# Patient Record
Sex: Female | Born: 1985 | Race: White | Hispanic: No | Marital: Single | Smoking: Former smoker
Health system: Southern US, Community
[De-identification: ages and names within clinical notes are randomized; demographics above are authoritative.]

## PROBLEM LIST (undated history)

## (undated) DIAGNOSIS — Z8619 Personal history of other infectious and parasitic diseases: Secondary | ICD-10-CM

## (undated) DIAGNOSIS — G901 Familial dysautonomia [Riley-Day]: Secondary | ICD-10-CM

## (undated) DIAGNOSIS — T7840XA Allergy, unspecified, initial encounter: Secondary | ICD-10-CM

## (undated) DIAGNOSIS — Z87898 Personal history of other specified conditions: Secondary | ICD-10-CM

## (undated) DIAGNOSIS — F419 Anxiety disorder, unspecified: Secondary | ICD-10-CM

## (undated) DIAGNOSIS — R55 Syncope and collapse: Secondary | ICD-10-CM

## (undated) HISTORY — PX: OTHER SURGICAL HISTORY: SHX169

## (undated) HISTORY — DX: Allergy, unspecified, initial encounter: T78.40XA

## (undated) HISTORY — DX: Syncope and collapse: R55

## (undated) HISTORY — DX: Anxiety disorder, unspecified: F41.9

## (undated) HISTORY — DX: Familial dysautonomia (riley-day): G90.1

## (undated) HISTORY — DX: Personal history of other specified conditions: Z87.898

## (undated) HISTORY — DX: Personal history of other infectious and parasitic diseases: Z86.19

---
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
## 2006-06-04 ENCOUNTER — Ambulatory Visit: Payer: Self-pay | Admitting: Family Medicine

## 2006-06-05 ENCOUNTER — Ambulatory Visit: Payer: Self-pay | Admitting: Family Medicine

## 2006-07-26 ENCOUNTER — Emergency Department (HOSPITAL_COMMUNITY): Admission: EM | Admit: 2006-07-26 | Discharge: 2006-07-26 | Payer: Self-pay | Admitting: Family Medicine

## 2006-09-23 ENCOUNTER — Emergency Department (HOSPITAL_COMMUNITY): Admission: EM | Admit: 2006-09-23 | Discharge: 2006-09-23 | Payer: Self-pay | Admitting: Family Medicine

## 2007-01-31 ENCOUNTER — Emergency Department (HOSPITAL_COMMUNITY): Admission: EM | Admit: 2007-01-31 | Discharge: 2007-01-31 | Payer: Self-pay | Admitting: Family Medicine

## 2007-02-02 ENCOUNTER — Encounter: Payer: Self-pay | Admitting: Family Medicine

## 2007-02-02 DIAGNOSIS — Z87898 Personal history of other specified conditions: Secondary | ICD-10-CM

## 2007-02-02 HISTORY — DX: Personal history of other specified conditions: Z87.898

## 2007-07-28 ENCOUNTER — Emergency Department (HOSPITAL_COMMUNITY): Admission: EM | Admit: 2007-07-28 | Discharge: 2007-07-28 | Payer: Self-pay | Admitting: Family Medicine

## 2008-01-21 ENCOUNTER — Emergency Department (HOSPITAL_COMMUNITY): Admission: EM | Admit: 2008-01-21 | Discharge: 2008-01-21 | Payer: Self-pay | Admitting: Family Medicine

## 2009-04-02 ENCOUNTER — Emergency Department (HOSPITAL_COMMUNITY): Admission: EM | Admit: 2009-04-02 | Discharge: 2009-04-02 | Payer: Self-pay | Admitting: Emergency Medicine

## 2009-07-02 ENCOUNTER — Inpatient Hospital Stay (HOSPITAL_COMMUNITY): Admission: AD | Admit: 2009-07-02 | Discharge: 2009-07-04 | Payer: Self-pay | Admitting: Obstetrics and Gynecology

## 2010-08-27 LAB — HM PAP SMEAR

## 2010-12-16 ENCOUNTER — Emergency Department (HOSPITAL_COMMUNITY)
Admission: EM | Admit: 2010-12-16 | Discharge: 2010-12-16 | Payer: Self-pay | Source: Home / Self Care | Admitting: Family Medicine

## 2011-02-22 LAB — CBC
HCT: 34.2 % — ABNORMAL LOW (ref 36.0–46.0)
MCHC: 34.2 g/dL (ref 30.0–36.0)
MCHC: 34.4 g/dL (ref 30.0–36.0)
MCV: 92.4 fL (ref 78.0–100.0)
Platelets: 181 10*3/uL (ref 150–400)
Platelets: 211 10*3/uL (ref 150–400)
RDW: 13.3 % (ref 11.5–15.5)

## 2011-02-22 LAB — RPR: RPR Ser Ql: NONREACTIVE

## 2011-04-01 NOTE — Discharge Summary (Signed)
NAMEMACHELLE, [name]              ACCOUNT NO.:  [id number]   MEDICAL RECORD NO.:  [id number]          PATIENT TYPE:  INP   LOCATION:  9107                          FACILITY:  [facility]   PHYSICIAN:  Huel Cote, M.D. DATE OF BIRTH:  10-Jan-1986   DATE OF ADMISSION:  07/02/2009  DATE OF DISCHARGE:  07/04/2009                               DISCHARGE SUMMARY   DISCHARGE DIAGNOSES:  1. Term pregnancy at 57 weeks' delivered.  2. Status post normal spontaneous vaginal delivery.   DISCHARGE MEDICATIONS:  1. Motrin 600 mg p.o. every 6 hours.  2. Percocet 1-2 tablets p.o. every 4 hours p.r.n.   DISCHARGE FOLLOWUP:  The patient is to followup in the office in 6 weeks  for her full postpartum exam.   HOSPITAL COURSE:  The patient is a 25 year old, G1, P0, who was admitted  at 31 and 1/7th weeks' gestation after she had presented to the office  with a regular contractions and cervical change to 4-5 cm.  Her prenatal  care was uncomplicated except for a history of anxiety and panic  attacks.   PRENATAL LABORATORY DATA:  O positive, antibody negative, rubella  immune, hepatitis B surface antigen negative, HIV negative, GC negative,  chlamydia negative, 1-hour Glucola normal, RPR negative, declined  genetic screening, and group B strep was negative.   PAST OBSTETRICAL HISTORY:  None.   PAST GYN HISTORY:  She had an ASCUS Pap smear which was positive HPV, so  she will repeat this postpartum.   PAST SURGICAL HISTORY:  None.   PAST MEDICAL HISTORY:  Anxiety and panic attacks, on no medications  currently.   ALLERGIES:  None.   MEDICATIONS:  She uses Ambien 10 mg p.o. in the last week for sleep.   She was afebrile with stable vital signs.  Fetal heart rate was  reactive.  Cervix after admission and receiving an epidural was 90, 6-7,  and -1 station.  She had rupture of membranes, which was clear and  continued to progress well.  She reached complete dilation and pushed  well with a  normal spontaneous vaginal delivery of a vigorous female  infant over a first-degree laceration.  Apgars were 9 and 9, weight was  6 pounds 15 ounces.  Placenta delivered spontaneously.  A first-degree  laceration was repaired with 2-0 Vicryl for hemostasis.  Postpartum, she  did  quite well.  Her hemoglobin was 10.9.  By postpartum day #2, she felt  well and was ready for discharge home.  She was discharged with Motrin  and Percocet medications and with followup in 6 weeks and was instructed  on pelvic rest.      Huel Cote, M.D.  Electronically Signed     KR/MEDQ  D:  07/04/2009  T:  07/04/2009  Job:  [id number]

## 2011-04-04 ENCOUNTER — Inpatient Hospital Stay (INDEPENDENT_AMBULATORY_CARE_PROVIDER_SITE_OTHER)
Admission: RE | Admit: 2011-04-04 | Discharge: 2011-04-04 | Disposition: A | Discharge status: Home / Self Care | Payer: 59 | Source: Ambulatory Visit | Attending: Family Medicine | Admitting: Family Medicine

## 2011-04-04 DIAGNOSIS — J029 Acute pharyngitis, unspecified: Secondary | ICD-10-CM

## 2011-04-05 LAB — STREP A DNA PROBE

## 2011-08-11 LAB — POCT PREGNANCY, URINE: Operator id: 126491

## 2011-08-11 LAB — POCT URINALYSIS DIP (DEVICE)
Bilirubin Urine: NEGATIVE
Glucose, UA: NEGATIVE
Ketones, ur: NEGATIVE
Nitrite: NEGATIVE
pH: 7

## 2011-08-29 LAB — POCT URINALYSIS DIP (DEVICE)
Bilirubin Urine: NEGATIVE
Glucose, UA: NEGATIVE
Ketones, ur: NEGATIVE
Nitrite: NEGATIVE
Operator id: 247071
Protein, ur: NEGATIVE
Specific Gravity, Urine: 1.01
Urobilinogen, UA: 0.2
pH: 5.5

## 2011-08-29 LAB — POCT PREGNANCY, URINE: Operator id: 239701

## 2013-01-03 ENCOUNTER — Ambulatory Visit (INDEPENDENT_AMBULATORY_CARE_PROVIDER_SITE_OTHER): Payer: 59 | Admitting: Internal Medicine

## 2013-01-03 ENCOUNTER — Encounter: Payer: Self-pay | Admitting: Internal Medicine

## 2013-01-03 VITALS — BP 117/74 | HR 90 | Ht 68.0 in | Wt 140.0 lb

## 2013-01-03 DIAGNOSIS — F419 Anxiety disorder, unspecified: Secondary | ICD-10-CM | POA: Insufficient documentation

## 2013-01-03 DIAGNOSIS — G901 Familial dysautonomia [Riley-Day]: Secondary | ICD-10-CM

## 2013-01-03 DIAGNOSIS — R55 Syncope and collapse: Secondary | ICD-10-CM

## 2013-01-03 DIAGNOSIS — G909 Disorder of the autonomic nervous system, unspecified: Secondary | ICD-10-CM

## 2013-01-03 DIAGNOSIS — F411 Generalized anxiety disorder: Secondary | ICD-10-CM

## 2013-01-03 HISTORY — DX: Syncope and collapse: R55

## 2013-01-03 HISTORY — DX: Familial dysautonomia (riley-day): G90.1

## 2013-01-03 NOTE — Patient Instructions (Addendum)
Your physician recommends that you schedule a follow-up appointment in: 6 weeks  

## 2013-01-03 NOTE — Assessment & Plan Note (Signed)
This patient's spells are little bit atypical but I think are most consistent with neurally mediated syncope. She has some evidence for postural tachycardia although not enough to make the diagnosis. She has some exercise intolerance also consistent with these diagnoses. We discussed extensively the physiology and the importance of salt and water repletion and potential aggravating factors of stress and anxiety.  I've given her website information from [employer].org as well as [employer].    We will plan to see her again in 3 or so months. At that time we'll consider the addition of anti anxiety/antidepressants although at that point I would prefer to refer her to somebody who can manage those medications with her better than I
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
## 2013-01-03 NOTE — Assessment & Plan Note (Signed)
She is being carried diagnoses of anxiety; I think it may be contributing but probably not the primary issue.

## 2013-01-03 NOTE — Assessment & Plan Note (Signed)
As above.

## 2013-01-06 ENCOUNTER — Telehealth: Payer: Self-pay | Admitting: Internal Medicine

## 2013-01-06 ENCOUNTER — Emergency Department (HOSPITAL_COMMUNITY)
Admission: EM | Admit: 2013-01-06 | Discharge: 2013-01-06 | Disposition: A | Discharge status: Home / Self Care | Payer: 59 | Source: Home / Self Care | Attending: Family Medicine | Admitting: Family Medicine

## 2013-01-06 ENCOUNTER — Telehealth: Payer: Self-pay

## 2013-01-06 ENCOUNTER — Encounter (HOSPITAL_COMMUNITY): Payer: Self-pay | Admitting: Emergency Medicine

## 2013-01-06 DIAGNOSIS — R55 Syncope and collapse: Secondary | ICD-10-CM

## 2013-01-06 DIAGNOSIS — F410 Panic disorder [episodic paroxysmal anxiety] without agoraphobia: Secondary | ICD-10-CM

## 2013-01-06 LAB — POCT I-STAT, CHEM 8
Creatinine, Ser: 0.7 mg/dL (ref 0.50–1.10)
HCT: 40 % (ref 36.0–46.0)
Hemoglobin: 13.6 g/dL (ref 12.0–15.0)
Potassium: 4 mEq/L (ref 3.5–5.1)
Sodium: 141 mEq/L (ref 135–145)
TCO2: 26 mmol/L (ref 0–100)

## 2013-01-06 NOTE — ED Notes (Signed)
sw pt in lobby pt, had episode last pm. Has diagnosis from her md for same.stated she feels she needs further elvalution

## 2013-01-06 NOTE — Telephone Encounter (Signed)
New problem    C/O not feeling well. On last night episode that last 2 hrs. Heart racing

## 2013-01-06 NOTE — ED Provider Notes (Signed)
History     CSN: [id number]  Arrival date & time 01/06/13  1254   First MD Initiated Contact with Patient 01/06/13 1259      Chief Complaint  Patient presents with  . Dizziness    (Consider location/radiation/quality/duration/timing/severity/associated sxs/prior treatment) HPI Comments: 27 year old female with past medical history significant for anxiety and neurocardiogenic syncopal episodes. Patient comes today complaining of a episode of irregular heartbeat, palpitations, dizziness and generalized weakness last night that lasted about 2 hours. Patient denies loss of consciousness last night. Patient states most of her episodes are not associated with loss of consciousness. Patient reports that she was "sweaty" at times, with sensation of being thrown at "cold and then hot water" had intermittent shortness or breath and felt like she "was going to die" and felt constraint like she "needed to run out of the house". Patient states that she had similar episodes that started in her teens then stop having episodes for about 3 years when she was put on anxiety medications. She stopped taking any anxiety medication after she lost her insurance and her symptoms restarted. She has had medical insurance again since last month. She has not seen a behavioral health specialist. Patient reports feeling well currently. Denies any of her described symptoms happening while she is here. But states she is worried about not having any control of one is she going to have a new event she lives with her husband and her toddler daughter. Patient reports she had been drinking plenty of fluids and salt intake yesterday and she was advised by her cardiologist.    Past Medical History  Diagnosis Date  . MIGRAINES, HX OF 02/02/2007    Qualifier: Diagnosis of  By: Hetty Ely MD, Franne Grip   . Syncope 01/03/2013  . Dysautonomia 01/03/2013    Past Surgical History  Procedure Laterality Date  . None      Family  History  Problem Relation Age of Onset  . Arrhythmia Mother   . Cervical cancer Mother     History  Substance Use Topics  . Smoking status: Never Smoker   . Smokeless tobacco: Not on file  . Alcohol Use: Yes     Comment: socially    OB History   Grav Para Term Preterm Abortions TAB SAB Ect Mult Living                  Review of Systems  Constitutional: Negative for fever and chills.  HENT: Negative for tinnitus.   Respiratory: Negative for shortness of breath.   Cardiovascular: Positive for palpitations. Negative for chest pain and leg swelling.  Gastrointestinal: Negative for nausea, vomiting, abdominal pain and diarrhea.  Genitourinary: Negative for dysuria and menstrual problem.  Skin: Negative for rash.  Neurological: Positive for dizziness. Negative for seizures, weakness, numbness and headaches.    Allergies  Review of patient's allergies indicates no known allergies.  Home Medications  No current outpatient prescriptions on file.  BP 125/84  Pulse 76  Temp(Src) 98.5 F (36.9 C) (Oral)  Resp 16  SpO2 100%  Physical Exam  Nursing note and vitals reviewed. Constitutional: She is oriented to person, place, and time. She appears well-developed and well-nourished. No distress.  HENT:  Head: Normocephalic and atraumatic.  Right Ear: External ear normal.  Left Ear: External ear normal.  Nose: Nose normal.  Mouth/Throat: Oropharynx is clear and moist. No oropharyngeal exudate.  Eyes: Conjunctivae are normal. Pupils are equal, round, and reactive to light.  Neck: Neck supple.  No JVD present. No thyromegaly present.  Cardiovascular: Normal rate, regular rhythm, normal heart sounds and intact distal pulses.  Exam reveals no gallop and no friction rub.   No murmur heard. Pulmonary/Chest: Effort normal and breath sounds normal. No respiratory distress. She has no wheezes. She has no rales. She exhibits no tenderness.  Abdominal: Soft. She exhibits no mass. There is  no tenderness.  Neurological: She is alert and oriented to person, place, and time.  Skin: No rash noted. She is not diaphoretic.    ED Course  Procedures (including critical care time)  Labs Reviewed  POCT I-STAT, CHEM 8 - Abnormal; Notable for the following:    Calcium, Ion 1.26 (*)    All other components within normal limits   No results found.   1. Syncope   2. Anxiety attack     EKG: Normal sinus rhythm with ventricular rate at 64 beats per minutes. No AV, BBB or fascicular blocks. No arrhythmias. No ST or other ischemic changes. Impress normal EKG.  MDM  Patient asymptomatic here. Normal orthostatic vital signs. Normal glucose and electrolytes. Is possible this patient has neurocardiogenic syncope concomitant with anxiety disorder. I recommend to continue cardiology evaluation and also provided a referral for behavioral health. Supportive care and red flags that should prompt her return to medical attention discussed with patient and provided in writing.        Sharin Grave, MD 01/08/13 1036

## 2013-01-06 NOTE — ED Notes (Signed)
Pt is here c/o dizziness and "blacking out"  Last episode was last night; will have a headache prior and after episode; also, rapid palpitations   Only last for a couple of seconds.  Saw Cardiologist on 01/03/13 and dx w/syncope; was told to increase fluids and salt Has been dx w/anxiety as well; believes this is gradually getting worse w/more freq episodes Also taking Amox for a tooth abscess.   She is alert w/no signs of acute distress.

## 2013-01-06 NOTE — Telephone Encounter (Signed)
Pt called last night pt experienced heart racing, dizziness,shakiness and trouble breathing. Today pt has no CP or trouble breathing but still thinks heart is beating fast and does not feel good. Pt wanted to schedule appt with Dr Milinda Antis; pt has not been seen since 09/02/2000;pt saw Dr Graciela Husbands on 01/03/13 and having BP issues.spoke with Carlena Sax, RN team lead and she advised me to tell pt to contact Dr Odessa Fleming office now (pt said she was OK and could call Dr Odessa Fleming office) to let Dr Graciela Husbands know how pt is feeling now. If pt condition changes or worsens prior to talking with Dr Koren Bound office pt to go to [facility]. Pt will call back later to schedule new pt appt.

## 2013-01-06 NOTE — Telephone Encounter (Signed)
Pt calls today b/c she had an episode last pm that lasted 2 hours "I was just lying on the couch watching tv. I felt like I was going to have a heart attack. Then I laid on the floor and got freezing cold and shaking. I felt weak and dizzy. I couldn't pick my head up."  States also her husband was with her at the time.  She has had no episodes today.  Her pcp was at [facility] but has not been there in over 3 years and would be a new patient. States she does not feel well and thinks she should be seen today. I did offer appt with Dr. Graciela Husbands tomorrow but she did not accept.  Transferred her to scheduling to make appt. Mylo Red RN

## 2013-01-12 ENCOUNTER — Encounter: Payer: Self-pay | Admitting: Physician Assistant

## 2013-01-12 ENCOUNTER — Ambulatory Visit (INDEPENDENT_AMBULATORY_CARE_PROVIDER_SITE_OTHER): Payer: 59 | Admitting: Physician Assistant

## 2013-01-12 VITALS — BP 110/72 | HR 86 | Ht 68.0 in | Wt 136.8 lb

## 2013-01-12 DIAGNOSIS — F411 Generalized anxiety disorder: Secondary | ICD-10-CM

## 2013-01-12 DIAGNOSIS — G909 Disorder of the autonomic nervous system, unspecified: Secondary | ICD-10-CM

## 2013-01-12 MED ORDER — PROPRANOLOL HCL 10 MG PO TABS: 5.0000 mg | ORAL_TABLET | ORAL | Status: DC | PRN

## 2013-01-12 NOTE — Patient Instructions (Addendum)
You have been referred to [facility] ; NEW PT APPT; DX Dysautonomia, anxiety, migraines; per scott weaver, pac needs to be seen in the next week; YOU HAVE AN APPT ON 01/18/13 @ 1:15 WITH REGINA BAITY, NP-C SHE IS LOCATED AT [address][phone]  START PROPRANOLOL 10 MG TABLET YOU WILL TAKE ONLY 1/2 TABLET AS NEEDED FOR RAPID PALPITATIONS  KEEP FOLLOW UP WITH DR. Graciela Husbands

## 2013-01-12 NOTE — Progress Notes (Signed)
[address], [address] [place], [place]  [address] Phone: [phone], Fax:  [phone]  Date:  01/12/2013   ID:  [name], DOB [birth year], MRN [id number]  PCP:  No primary provider on file.  Primary Cardiologist:  Dr. Sherryl Manges     History of Present Illness: [name] is a 27 y.o. female who returns for evaluation of dizziness.   She has a hx of syncope. She was evaluated by Dr. Graciela Husbands 01/03/13. He felt that her symptoms were most likely consistent with neurally mediated syncope and did have some evidence for postural tachycardia. She was given information regarding POTS and was asked to follow up in 6 weeks.  Patient was then seen in the emergency room 01/06/13. She had complaints of irregular heartbeat, palpitations and dizziness. Notes indicate she had normal orthostatic vital signs.    Patient states she went to the ED after developing tachycardia that progressed into dizziness and near syncope.  She denies frank syncope.  She has increased salt and water in her diet.  Urine has been clear.  She is not orthostatic today.  No chest pain or dyspnea.  Overall, her biggest complaint is that she is suffering from anxiety.  She is afraid to do just about anything.  She does not feel depressed.  She did take xanax at one point years ago.    Labs (2/14):  K 4, creatinine 0.70, Hgb 13.6   Wt Readings from Last 3 Encounters:  01/12/13 136 lb 12.8 oz (62.052 kg)  01/03/13 140 lb (63.504 kg)  09/02/00 98 lb (44.453 kg) (46%*, Z = -0.10)   * Growth percentiles are based on CDC 2-20 Years data.     Past Medical History  Diagnosis Date  . MIGRAINES, HX OF 02/02/2007    Qualifier: Diagnosis of  By: Hetty Ely MD, Franne Grip   . Syncope 01/03/2013  . Dysautonomia 01/03/2013    Current Outpatient Prescriptions  Medication Sig Dispense Refill  . cetirizine (ZYRTEC) 10 MG tablet Take 10 mg by mouth as needed for allergies.       No current facility-administered  medications for this visit.    Allergies:   No Known Allergies  Social History:  The patient  reports that she has never smoked. She does not have any smokeless tobacco history on file. She reports that  drinks alcohol. She reports that she does not use illicit drugs.   ROS:  Please see the history of present illness.      All other systems reviewed and negative.   PHYSICAL EXAM: VS:  BP 110/72  Pulse 86  Ht 5\' 8"  (1.727 m)  Wt 136 lb 12.8 oz (62.052 kg)  BMI 20.81 kg/m2  Filed Vitals:   01/12/13 1552 01/12/13 1606 01/12/13 1607 01/12/13 1608  BP: 122/68 118/72 118/76 110/72  Pulse: 75 75 83 86  Height: 5\' 8"  (1.727 m)     Weight: 136 lb 12.8 oz (62.052 kg)       Well nourished, well developed, in no acute distress HEENT: normal Neck: no JVD Cardiac:  normal S1, S2; RRR; no murmur Lungs:  clear to auscultation bilaterally, no wheezing, rhonchi or rales Abd: soft, nontender, no hepatomegaly Ext: no edema Skin: warm and dry Neuro:  CNs 2-12 intact, no focal abnormalities noted  EKG:  NSR, HR 76, normal axis, no acute change     ASSESSMENT AND PLAN:  1. Dysautonomia:  Her symptoms seem to have improved somewhat with increasing  her salt load and increasing her hydration. However, she is quite anxious about recurrent episodes occurring in public. This has created a significant amount of anxiety for her. She is afraid to do the things that she typically enjoys. I will give her a prescription for propranolol 10 mg half a tablet daily as needed for rapid palpitations. This will hopefully help alleviate her symptoms when they occur.  Keep follow up with Dr. Sherryl Manges as planned. 2. Anxiety:  I believe she would benefit from treatment of her anxiety.  I have referred her to primary care to help decide which drug to use.  She will see someone next week. 3. Disposition:  Follow up with Dr. Sherryl Manges in April as planned.   Signed, Tereso Newcomer, PA-C  4:12 PM 01/12/2013

## 2013-01-18 ENCOUNTER — Encounter: Payer: Self-pay | Admitting: Internal Medicine

## 2013-01-18 ENCOUNTER — Ambulatory Visit (INDEPENDENT_AMBULATORY_CARE_PROVIDER_SITE_OTHER): Payer: 59 | Admitting: Internal Medicine

## 2013-01-18 VITALS — BP 122/68 | HR 92 | Temp 98.3°F | Ht 68.0 in | Wt 137.0 lb

## 2013-01-18 DIAGNOSIS — F411 Generalized anxiety disorder: Secondary | ICD-10-CM

## 2013-01-18 DIAGNOSIS — F419 Anxiety disorder, unspecified: Secondary | ICD-10-CM

## 2013-01-18 MED ORDER — ALPRAZOLAM 0.5 MG PO TABS: 0.5000 mg | ORAL_TABLET | Freq: Every evening | ORAL | Status: DC | PRN

## 2013-01-18 MED ORDER — FLUOXETINE HCL 10 MG PO CAPS: 10.0000 mg | ORAL_CAPSULE | Freq: Every day | ORAL | Status: DC

## 2013-01-18 NOTE — Progress Notes (Signed)
Subjective:    Patient ID: [name], female    DOB: [birth year], 27 y.o.   MRN: [id number]  HPI  Pt presents to the clinic today with c/o anxiety. This is a new onset. It started after she was having episodes of increased heart rate and syncope. She was evaluated by cardiology who diagnosed her with POTS. She was placed on propanolol as needed. Since her diagnosis, she has been non stop worrying about wether she will have another episode in public. She afraid to drive, go out to eat or do anything else. She has taken xanax in the past for anxiety issues as a teenager. This has been a number of years ago.   Review of Systems      Past Medical History  Diagnosis Date  . MIGRAINES, HX OF 02/02/2007    Qualifier: Diagnosis of  By: Hetty Ely MD, Franne Grip   . Syncope 01/03/2013  . Dysautonomia 01/03/2013  . History of chicken pox   . Allergy   . Anxiety     Current Outpatient Prescriptions  Medication Sig Dispense Refill  . cetirizine (ZYRTEC) 10 MG tablet Take 10 mg by mouth as needed for allergies.      Marland Kitchen propranolol (INDERAL) 10 MG tablet Take 0.5 tablets (5 mg total) by mouth as needed.  30 tablet  1  . ALPRAZolam (XANAX) 0.5 MG tablet Take 1 tablet (0.5 mg total) by mouth at bedtime as needed for sleep.  30 tablet  0  . FLUoxetine (PROZAC) 10 MG capsule Take 1 capsule (10 mg total) by mouth daily.  30 capsule  0   No current facility-administered medications for this visit.    No Known Allergies  Family History  Problem Relation Age of Onset  . Arrhythmia Mother   . Cervical cancer Mother   . Diabetes Maternal Grandmother   . Diabetes Maternal Grandfather     History   Social History  . Marital Status: Single    Spouse Name: N/A    Number of Children: 1  . Years of Education: 12   Occupational History  . cleans houses    Social History Main Topics  . Smoking status: Former Games developer  . Smokeless tobacco: Never Used  . Alcohol Use: Yes     Comment: socially   . Drug Use: No  . Sexually Active: Yes    Birth Control/ Protection: IUD   Other Topics Concern  . Not on file   Social History Narrative   Regular exercise-no   Caffeine Use-yes           Constitutional: Denies fever, malaise, fatigue, headache or abrupt weight changes.  Respiratory: Pt reports shortness of breath during episodes. Denies difficulty breathing, cough or sputum production.   Cardiovascular: Pt reports palpitations. Denies chest pain, chest tightness, or swelling in the hands or feet.  Psych: Pt reports anxiety. Denies depression, SI/HI   No other specific complaints in a complete review of systems (except as listed in HPI above).  Objective:   Physical Exam  BP 122/68  Pulse 92  Temp(Src) 98.3 F (36.8 C) (Oral)  Ht 5\' 8"  (1.727 m)  Wt 137 lb (62.143 kg)  BMI 20.84 kg/m2  SpO2 97% Wt Readings from Last 3 Encounters:  01/18/13 137 lb (62.143 kg)  01/12/13 136 lb 12.8 oz (62.052 kg)  01/03/13 140 lb (63.504 kg)    General: Appears her stated age, well developed, well nourished in NAD. Cardiovascular: Normal rate and rhythm. S1,S2 noted.  No murmur, rubs or gallops noted. No JVD or BLE edema. No carotid bruits noted. Pulmonary/Chest: Normal effort and positive vesicular breath sounds. No respiratory distress. No wheezes, rales or ronchi noted.  Psychiatric: Mood anxious and affect normal. Behavior is normal. Judgment and thought content normal.       Assessment & Plan:

## 2013-01-18 NOTE — Assessment & Plan Note (Signed)
eRx for Prozac 10 mg daily eRx for xanax 0.5 mg #30 no refills May benefit from CBT  RTC in month for reevaluation

## 2013-01-18 NOTE — Patient Instructions (Signed)

## 2013-02-15 ENCOUNTER — Ambulatory Visit (INDEPENDENT_AMBULATORY_CARE_PROVIDER_SITE_OTHER): Payer: 59 | Admitting: Internal Medicine

## 2013-02-15 ENCOUNTER — Ambulatory Visit: Payer: 59 | Admitting: Internal Medicine

## 2013-02-15 ENCOUNTER — Encounter: Payer: Self-pay | Admitting: Internal Medicine

## 2013-02-15 VITALS — BP 110/70 | HR 78 | Temp 99.2°F | Ht 68.0 in | Wt 136.0 lb

## 2013-02-15 DIAGNOSIS — F419 Anxiety disorder, unspecified: Secondary | ICD-10-CM

## 2013-02-15 DIAGNOSIS — F411 Generalized anxiety disorder: Secondary | ICD-10-CM

## 2013-02-15 NOTE — Assessment & Plan Note (Signed)
Will taper of the prozac secondary to unwanted side effects Will continue on the xanax-breaking them in half as needed

## 2013-02-15 NOTE — Patient Instructions (Signed)

## 2013-02-15 NOTE — Progress Notes (Signed)
Subjective:    Patient ID: [name], female    DOB: 04-27-1986, 27 y.o.   MRN: [id number]  HPI  Pt presents to the clinic today to f/u for anxiety and panic attacks. At her last visit, she was started on Prozac 10 mg daily with xanax for breakthrough. She has been tolerating the medication well without any side effects. She does feel like the prozac is making her too numb. She feels like she is in another planet. She has been more tearful since starting on the prozac. She has been doing well on the xanax. She has been breaking them in half and they seem to calm her down nicely.  Review of Systems      Past Medical History  Diagnosis Date  . MIGRAINES, HX OF 02/02/2007    Qualifier: Diagnosis of  By: Hetty Ely MD, Franne Grip   . Syncope 01/03/2013  . Dysautonomia 01/03/2013  . History of chicken pox   . Allergy   . Anxiety     Current Outpatient Prescriptions  Medication Sig Dispense Refill  . ALPRAZolam (XANAX) 0.5 MG tablet Take 1 tablet (0.5 mg total) by mouth at bedtime as needed for sleep.  30 tablet  0  . cetirizine (ZYRTEC) 10 MG tablet Take 10 mg by mouth as needed for allergies.      Marland Kitchen FLUoxetine (PROZAC) 10 MG capsule Take 1 capsule (10 mg total) by mouth daily.  30 capsule  0  . propranolol (INDERAL) 10 MG tablet Take 0.5 tablets (5 mg total) by mouth as needed.  30 tablet  1   No current facility-administered medications for this visit.    No Known Allergies  Family History  Problem Relation Age of Onset  . Arrhythmia Mother   . Cervical cancer Mother   . Diabetes Maternal Grandmother   . Diabetes Maternal Grandfather     History   Social History  . Marital Status: Single    Spouse Name: N/A    Number of Children: 1  . Years of Education: 12   Occupational History  . cleans houses    Social History Main Topics  . Smoking status: Former Games developer  . Smokeless tobacco: Never Used  . Alcohol Use: Yes     Comment: socially  . Drug Use: No  .  Sexually Active: Yes    Birth Control/ Protection: IUD   Other Topics Concern  . Not on file   Social History Narrative   Regular exercise-no   Caffeine Use-yes           Constitutional: Denies fever, malaise, fatigue, headache or abrupt weight changes.  Respiratory: Denies difficulty breathing, shortness of breath, cough or sputum production.   Cardiovascular: Denies chest pain, chest tightness, palpitations or swelling in the hands or feet.   Neurological: Denies dizziness, difficulty with memory, difficulty with speech or problems with balance and coordination.  Psych: Pt reports anxiety, although better controlled. Denies depression, SI/HI.  No other specific complaints in a complete review of systems (except as listed in HPI above).  Objective:   Physical Exam   BP 110/70  Pulse 78  Temp(Src) 99.2 F (37.3 C) (Oral)  Ht 5\' 8"  (1.727 m)  Wt 136 lb (61.689 kg)  BMI 20.68 kg/m2  SpO2 98% Wt Readings from Last 3 Encounters:  02/15/13 136 lb (61.689 kg)  01/18/13 137 lb (62.143 kg)  01/12/13 136 lb 12.8 oz (62.052 kg)    General: Appears her stated age, well developed, well  nourished in NAD. Cardiovascular: Normal rate and rhythm. S1,S2 noted.  No murmur, rubs or gallops noted. No JVD or BLE edema. No carotid bruits noted. Pulmonary/Chest: Normal effort and positive vesicular breath sounds. No respiratory distress. No wheezes, rales or ronchi noted.  Neurological: Alert and oriented. Cranial nerves II-XII intact. Coordination normal. +DTRs bilaterally. Psychiatric: Mood less anxious and affect normal. Behavior is normal. Judgment and thought content normal.        Assessment & Plan:

## 2013-02-16 ENCOUNTER — Ambulatory Visit (INDEPENDENT_AMBULATORY_CARE_PROVIDER_SITE_OTHER): Payer: 59 | Admitting: Internal Medicine

## 2013-02-16 ENCOUNTER — Encounter: Payer: Self-pay | Admitting: Internal Medicine

## 2013-02-16 VITALS — BP 108/56 | HR 82 | Ht 68.0 in | Wt 135.8 lb

## 2013-02-16 DIAGNOSIS — G909 Disorder of the autonomic nervous system, unspecified: Secondary | ICD-10-CM

## 2013-02-16 DIAGNOSIS — G901 Familial dysautonomia [Riley-Day]: Secondary | ICD-10-CM

## 2013-02-16 NOTE — Patient Instructions (Addendum)
Your physician recommends that you schedule a follow-up appointment in: 8-10 weeks

## 2013-02-16 NOTE — Progress Notes (Signed)
Patient Care Team: Nicki Reaper, NP as PCP - General (Internal Medicine) Duke Salvia, MD as Attending Physician (Cardiology)   HPI  [name] is a 27 y.o. female Seen in followup for symptoms consistent with POTS/dysautonomia. She is better with fluid repletion. She is sick and tired of eating salt . She has noted some degree of heat intolerance Past Medical History  Diagnosis Date  . MIGRAINES, HX OF 02/02/2007    Qualifier: Diagnosis of  By: Hetty Ely MD, Franne Grip   . Syncope 01/03/2013  . Dysautonomia 01/03/2013  . History of chicken pox   . Allergy   . Anxiety     Past Surgical History  Procedure Laterality Date  . None      Current Outpatient Prescriptions  Medication Sig Dispense Refill  . ALPRAZolam (XANAX) 0.5 MG tablet Take 1 tablet (0.5 mg total) by mouth at bedtime as needed for sleep.  30 tablet  0  . cetirizine (ZYRTEC) 10 MG tablet Take 10 mg by mouth as needed for allergies.      Marland Kitchen propranolol (INDERAL) 10 MG tablet Take 0.5 tablets (5 mg total) by mouth as needed.  30 tablet  1   No current facility-administered medications for this visit.    No Known Allergies  Review of Systems negative except from HPI and PMH  Physical Exam BP 108/56  Pulse 82  Ht 5\' 8"  (1.727 m)  Wt 135 lb 12.8 oz (61.598 kg)  BMI 20.65 kg/m2 Well developed and nourished in no acute distress HENT normal Neck supple with JVP-flat Clear Regular rate and rhythm, no murmurs or gallops Abd-soft with active BS No Clubbing cyanosis edema Skin-warm and dry A & Oriented  Grossly normal sensory and motor function     Assessment and  Plan

## 2013-02-16 NOTE — Assessment & Plan Note (Signed)
Patient is somewhat better. We will change her salt to using thermatabs We'll plan to see her.8-10 weeks. I've advised her about potential issues related to heat.

## 2013-03-25 ENCOUNTER — Other Ambulatory Visit: Payer: Self-pay | Admitting: Internal Medicine

## 2013-03-25 NOTE — Telephone Encounter (Signed)
Rene Kocher out of office. pls advise...Raechel Chute

## 2013-03-25 NOTE — Telephone Encounter (Signed)
Ok to refill 30d, no refill (per protocol covering for absent PCP) - prescription printed and signed -  

## 2013-03-25 NOTE — Telephone Encounter (Signed)
Faxed script bck to [facility].../lmb 

## 2013-04-19 ENCOUNTER — Ambulatory Visit: Payer: 59 | Admitting: Internal Medicine

## 2013-04-26 ENCOUNTER — Encounter: Payer: Self-pay | Admitting: Internal Medicine

## 2013-08-04 ENCOUNTER — Telehealth: Payer: Self-pay

## 2013-08-04 NOTE — Telephone Encounter (Signed)
Requesting refill on Alprazolam

## 2013-08-05 ENCOUNTER — Other Ambulatory Visit: Payer: Self-pay

## 2013-08-05 DIAGNOSIS — F419 Anxiety disorder, unspecified: Secondary | ICD-10-CM

## 2013-08-05 MED ORDER — ALPRAZOLAM 0.5 MG PO TABS: ORAL_TABLET | ORAL | Status: DC

## 2013-08-05 NOTE — Telephone Encounter (Signed)
Ok to refill 

## 2013-08-05 NOTE — Telephone Encounter (Signed)
Refill printed and faxed to pharmacy on file...ds,cma

## 2014-03-13 ENCOUNTER — Other Ambulatory Visit: Payer: Self-pay | Admitting: Internal Medicine

## 2014-03-21 ENCOUNTER — Encounter: Payer: Self-pay | Admitting: Internal Medicine

## 2014-03-21 ENCOUNTER — Ambulatory Visit (INDEPENDENT_AMBULATORY_CARE_PROVIDER_SITE_OTHER): Payer: 59 | Admitting: Internal Medicine

## 2014-03-21 VITALS — BP 104/64 | HR 72 | Temp 98.6°F | Wt 149.5 lb

## 2014-03-21 DIAGNOSIS — F419 Anxiety disorder, unspecified: Secondary | ICD-10-CM

## 2014-03-21 DIAGNOSIS — F411 Generalized anxiety disorder: Secondary | ICD-10-CM

## 2014-03-21 MED ORDER — ALPRAZOLAM 0.5 MG PO TABS: ORAL_TABLET | ORAL | Status: DC

## 2014-03-21 NOTE — Progress Notes (Signed)
Subjective:    Patient ID: [name], female    DOB: 07-13-1986, 28 y.o.   MRN: [id number]  HPI  Pt presents to the clinic today to follow up anxiety. She was originally seen for this 01/18/13. She was started on prozac and xanax. She reports the prozac made her feel numb. She reports the anxiety is improved when she takes the xanax. Last panic attack was 1 week ago. She had been breaking the pills in half.  Review of Systems      Past Medical History  Diagnosis Date  . MIGRAINES, HX OF 02/02/2007    Qualifier: Diagnosis of  By: Hetty ElySchaller MD, Franne Gripobert Neal   . Syncope 01/03/2013  . Dysautonomia 01/03/2013  . History of chicken pox   . Allergy   . Anxiety     Current Outpatient Prescriptions  Medication Sig Dispense Refill  . ALPRAZolam (XANAX) 0.5 MG tablet TAKE 1 TABLET BY MOUTH EVERY NIGHT AT BEDTIME AS NEEDED FOR SLEEP  30 tablet  0  . cetirizine (ZYRTEC) 10 MG tablet Take 10 mg by mouth as needed for allergies.       No current facility-administered medications for this visit.    No Known Allergies  Family History  Problem Relation Age of Onset  . Arrhythmia Mother   . Cervical cancer Mother   . Diabetes Maternal Grandmother   . Diabetes Maternal Grandfather     History   Social History  . Marital Status: Single    Spouse Name: N/A    Number of Children: 1  . Years of Education: 12   Occupational History  . cleans houses    Social History Main Topics  . Smoking status: Former Games developermoker  . Smokeless tobacco: Never Used  . Alcohol Use: Yes     Comment: occasional  . Drug Use: No  . Sexual Activity: Yes    Birth Control/ Protection: IUD   Other Topics Concern  . Not on file   Social History Narrative   Regular exercise-no   Caffeine Use-yes           Constitutional: Denies fever, malaise, fatigue, headache or abrupt weight changes.  Psych: Pt reports anxiety. Denies depression, SI/HI.  No other specific complaints in a complete review of  systems (except as listed in HPI above).  Objective:   Physical Exam   BP 104/64  Pulse 72  Temp(Src) 98.6 F (37 C) (Oral)  Wt 149 lb 8 oz (67.813 kg)  SpO2 98% Wt Readings from Last 3 Encounters:  03/21/14 149 lb 8 oz (67.813 kg)  02/16/13 135 lb 12.8 oz (61.598 kg)  02/15/13 136 lb (61.689 kg)    General: Appears her stated age, well developed, well nourished in NAD. Cardiovascular: Normal rate and rhythm. S1,S2 noted.  No murmur, rubs or gallops noted. No JVD or BLE edema. No carotid bruits noted. Pulmonary/Chest: Normal effort and positive vesicular breath sounds. No respiratory distress. No wheezes, rales or ronchi noted.  Psychiatric: Mood and affect normal. Behavior is normal. Judgment and thought content normal.     BMET    Component Value Date/Time   NA 141 01/06/2013 1522   K 4.0 01/06/2013 1522   CL 106 01/06/2013 1522   GLUCOSE 88 01/06/2013 1522   BUN 10 01/06/2013 1522   CREATININE 0.70 01/06/2013 1522    Lipid Panel  No results found for this basename: chol, trig, hdl, cholhdl, vldl, ldlcalc    CBC    Component Value  Date/Time   WBC 15.3* 07/03/2009 0540   RBC 3.38* 07/03/2009 0540   HGB 13.6 01/06/2013 1522   HCT 40.0 01/06/2013 1522   PLT 181 07/03/2009 0540   MCV 94.3 07/03/2009 0540   MCHC 34.2 07/03/2009 0540   RDW 13.3 07/03/2009 0540    Hgb A1C No results found for this basename: HGBA1C        Assessment & Plan:

## 2014-03-21 NOTE — Patient Instructions (Addendum)

## 2014-03-21 NOTE — Assessment & Plan Note (Signed)
Xanax refilled today Advised her that this is not a medication to be taken every day If she is using it daily, we need to consider trying another SSRI such as lexapro She should consider CBT

## 2014-03-21 NOTE — Progress Notes (Signed)
Pre visit review using our clinic review tool, if applicable. No additional management support is needed unless otherwise documented below in the visit note. 

## 2014-05-29 ENCOUNTER — Other Ambulatory Visit: Payer: Self-pay | Admitting: Internal Medicine

## 2014-05-30 NOTE — Telephone Encounter (Signed)
Rx called in to pharmacy. 

## 2014-05-30 NOTE — Telephone Encounter (Signed)
Pt is aware.  

## 2014-05-30 NOTE — Telephone Encounter (Signed)
Pt left v/m requesting cb for status of Xanax refill.

## 2014-05-30 NOTE — Telephone Encounter (Signed)
Last filled 03/21/14--please advise 

## 2014-05-30 NOTE — Telephone Encounter (Signed)
Ok to phone in xanax 

## 2014-08-31 ENCOUNTER — Other Ambulatory Visit: Payer: Self-pay | Admitting: Internal Medicine

## 2014-09-01 ENCOUNTER — Other Ambulatory Visit: Payer: Self-pay

## 2014-09-01 NOTE — Telephone Encounter (Signed)
Pt left v/m requesting status of alprazolam refill. 

## 2014-09-01 NOTE — Telephone Encounter (Signed)
See 08/31/14 refill note.

## 2014-09-01 NOTE — Telephone Encounter (Signed)
Last filled 05/2014--please advise

## 2014-09-01 NOTE — Telephone Encounter (Signed)
Ok to phone in xanax 

## 2014-09-04 ENCOUNTER — Other Ambulatory Visit: Payer: Self-pay | Admitting: Internal Medicine

## 2014-09-04 NOTE — Telephone Encounter (Signed)
Pt request status of xanax refill; advised called to [facility]; pt will ck with pharmacy later today.

## 2014-09-05 NOTE — Telephone Encounter (Signed)
#  30x0 last filled 05/30/14, last appt was for med refill on 03/21/14, no future appts scheduled.

## 2014-11-08 ENCOUNTER — Other Ambulatory Visit: Payer: Self-pay

## 2014-11-08 MED ORDER — ALPRAZOLAM 0.5 MG PO TABS: 0.5000 mg | ORAL_TABLET | Freq: Every day | ORAL | Status: DC

## 2014-11-08 NOTE — Telephone Encounter (Signed)
Pt left v/m requesting refill alprazolam to [facility]. Please advise. Pt last seen 03/21/14 and no future appt scheduled.

## 2014-11-08 NOTE — Telephone Encounter (Signed)
Pt states she will come in tomorrow to sign CSA and i will call in Xanax after

## 2014-11-08 NOTE — Telephone Encounter (Signed)
Ok to phone in xanax. Will need CSA and UDS

## 2014-11-09 ENCOUNTER — Encounter: Payer: Self-pay | Admitting: Internal Medicine

## 2014-11-13 NOTE — Telephone Encounter (Signed)
Rx called in to pharmacy. 

## 2014-11-29 ENCOUNTER — Encounter: Payer: Self-pay | Admitting: Internal Medicine

## 2015-01-31 ENCOUNTER — Other Ambulatory Visit: Payer: Self-pay | Admitting: Internal Medicine

## 2015-01-31 NOTE — Telephone Encounter (Signed)
Last filled 11/08/2014--pt has no upcoming appts last OV 03/2014--I do not see where a CPE has been done--please advise

## 2015-01-31 NOTE — Telephone Encounter (Signed)
Rx called in to pharmacy. 

## 2017-07-20 ENCOUNTER — Emergency Department (HOSPITAL_COMMUNITY)
Admission: EM | Admit: 2017-07-20 | Discharge: 2017-07-20 | Disposition: A | Discharge status: Home / Self Care | Payer: No Typology Code available for payment source | Attending: Emergency Medicine | Admitting: Emergency Medicine

## 2017-07-20 ENCOUNTER — Encounter (HOSPITAL_COMMUNITY): Payer: Self-pay

## 2017-07-20 DIAGNOSIS — H02846 Edema of left eye, unspecified eyelid: Secondary | ICD-10-CM | POA: Diagnosis not present

## 2017-07-20 DIAGNOSIS — Z5321 Procedure and treatment not carried out due to patient leaving prior to being seen by health care provider: Secondary | ICD-10-CM | POA: Insufficient documentation
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
## 2017-07-20 NOTE — ED Notes (Signed)
Pt walks out of room does not answer anyone when ask where she was headed. Pt left without being seen.
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
## 2017-09-23 ENCOUNTER — Emergency Department (HOSPITAL_COMMUNITY)
Admission: EM | Admit: 2017-09-23 | Discharge: 2017-09-23 | Disposition: A | Discharge status: Home / Self Care | Payer: Self-pay | Attending: Emergency Medicine | Admitting: Emergency Medicine

## 2017-09-23 ENCOUNTER — Encounter (HOSPITAL_COMMUNITY): Payer: Self-pay | Admitting: Emergency Medicine

## 2017-09-23 ENCOUNTER — Emergency Department (HOSPITAL_COMMUNITY): Payer: Self-pay

## 2017-09-23 DIAGNOSIS — R091 Pleurisy: Secondary | ICD-10-CM | POA: Insufficient documentation

## 2017-09-23 DIAGNOSIS — Z79899 Other long term (current) drug therapy: Secondary | ICD-10-CM | POA: Insufficient documentation

## 2017-09-23 DIAGNOSIS — Z87891 Personal history of nicotine dependence: Secondary | ICD-10-CM | POA: Insufficient documentation

## 2017-09-23 LAB — CBC
HEMATOCRIT: 40.1 % (ref 36.0–46.0)
HEMOGLOBIN: 13.8 g/dL (ref 12.0–15.0)
MCH: 31.7 pg (ref 26.0–34.0)
MCHC: 34.4 g/dL (ref 30.0–36.0)
MCV: 92 fL (ref 78.0–100.0)
Platelets: 210 10*3/uL (ref 150–400)
RBC: 4.36 MIL/uL (ref 3.87–5.11)
RDW: 12.7 % (ref 11.5–15.5)
WBC: 10.4 10*3/uL (ref 4.0–10.5)

## 2017-09-23 LAB — BASIC METABOLIC PANEL
ANION GAP: 8 (ref 5–15)
BUN: 12 mg/dL (ref 6–20)
CALCIUM: 9.2 mg/dL (ref 8.9–10.3)
CHLORIDE: 104 mmol/L (ref 101–111)
CO2: 24 mmol/L (ref 22–32)
Creatinine, Ser: 0.47 mg/dL (ref 0.44–1.00)
GFR calc Af Amer: >60 mL/min (ref >60)
GFR calc non Af Amer: >60 mL/min (ref >60)
GLUCOSE: 96 mg/dL (ref 65–99)
Potassium: 3.7 mmol/L (ref 3.5–5.1)
Sodium: 136 mmol/L (ref 135–145)

## 2017-09-23 LAB — I-STAT TROPONIN, ED: TROPONIN I, POC: 0 ng/mL (ref 0.00–0.08)

## 2017-09-23 LAB — D-DIMER, QUANTITATIVE: D-Dimer, Quant: <0.27 ug/mL-FEU (ref 0.00–0.50)

## 2017-09-23 LAB — I-STAT BETA HCG BLOOD, ED (MC, WL, AP ONLY): I-stat hCG, quantitative: 8.3 m[IU]/mL — ABNORMAL HIGH (ref <5)

## 2017-09-23 MED ORDER — HYDROCODONE-ACETAMINOPHEN 5-325 MG PO TABS
1.0000 | ORAL_TABLET | ORAL | Status: AC
  Administered 2017-09-23: 1 via ORAL
  Filled 2017-09-23: qty 1

## 2017-09-23 MED ORDER — NAPROXEN 500 MG PO TABS: 500.0000 mg | ORAL_TABLET | Freq: Two times a day (BID) | ORAL | 0 refills | Status: DC

## 2017-09-23 MED ORDER — KETOROLAC TROMETHAMINE 30 MG/ML IJ SOLN
30.0000 mg | Freq: Once | INTRAMUSCULAR | Status: AC
  Administered 2017-09-23: 30 mg via INTRAVENOUS
  Filled 2017-09-23: qty 1

## 2017-09-23 NOTE — Discharge Instructions (Signed)
Take the Medications as needed for pain, you can also try over-the-counter topical medication such as capsaicin cream.  Follow-up with a primary care doctor next week if not improving.  Return to the emergency room for worsening symptoms.

## 2017-09-23 NOTE — ED Provider Notes (Signed)
[facility]-EMERGENCY DEPT Provider Note   CSN: [id number] Arrival date & time: 09/23/17  0910     History   Chief Complaint Chief Complaint  Patient presents with  . Chest Pain    HPI [name] is a 31 y.o. female.  HPI Patient presents to the emergency room for evaluation of left-sided chest pain.  Patient states she woke up this morning and experienced sharp pain underneath her left breast.  The pain increases with deep breathing as well as palpation of her left chest.  She denies any recent falls or injuries.  She denies any fevers or chills.  She has not been coughing.  She denies any leg swelling.  No prior history of PE, DVT or chest pain.  Patient uses a Mirena contraception Past Medical History:  Diagnosis Date  . Allergy   . Anxiety   . Dysautonomia (HCC) 01/03/2013  . History of chicken pox   . MIGRAINES, HX OF 02/02/2007   Qualifier: Diagnosis of  By: Hetty ElySchaller MD, Franne Gripobert Neal   . Syncope 01/03/2013    Patient Active Problem List   Diagnosis Date Noted  . Dysautonomia (HCC) 01/03/2013  . Anxiety 01/03/2013  . MIGRAINES, HX OF 02/02/2007    Past Surgical History:  Procedure Laterality Date  . none      OB History    No data available       Home Medications    Prior to Admission medications   Medication Sig Start Date End Date Taking? Authorizing Provider  cetirizine (ZYRTEC) 10 MG tablet Take 10 mg daily by mouth.    Yes [provider]  dextromethorphan-guaiFENesin (MUCINEX DM) 30-600 MG 12hr tablet Take 1 tablet 2 (two) times daily by mouth.   Yes [provider]  ibuprofen (ADVIL,MOTRIN) 200 MG tablet Take 400 mg every 6 (six) hours as needed by mouth for headache.   Yes [provider]  Phenylephrine-DM-GG-APAP (DELSYM COUGH/COLD DAYTIME) 5-10-200-325 MG/10ML LIQD Take 30 mLs every 12 (twelve) hours as needed by mouth (cough, congestion).   Yes [provider]  ALPRAZolam Prudy Feeler(XANAX) 0.5 MG  tablet TAKE 1 TABLET BY MOUTH AT BEDTIME AS NEEDED Patient not taking: Reported on 09/23/2017 01/31/15   Lorre MunroeBaity, Regina W, NP  naproxen (NAPROSYN) 500 MG tablet Take 1 tablet (500 mg total) 2 (two) times daily with a meal by mouth. As needed for pain 09/23/17   Linwood DibblesKnapp, Kiaja Shorty, MD    Family History Family History  Problem Relation Age of Onset  . Arrhythmia Mother   . Cervical cancer Mother   . Diabetes Maternal Grandmother   . Diabetes Maternal Grandfather     Social History Social History   Tobacco Use  . Smoking status: Former Games developermoker  . Smokeless tobacco: Never Used  Substance Use Topics  . Alcohol use: Yes    Comment: occasional  . Drug use: No     Allergies   Patient has no known allergies.   Review of Systems Review of Systems   Physical Exam Updated Vital Signs BP 109/80 (BP Location: Left Arm)   Pulse 63   Temp 98.2 F (36.8 C) (Oral)   Resp 19   Ht 1.727 m (5\' 8" )   Wt 68 kg (150 lb)   SpO2 95%   BMI 22.81 kg/m   Physical Exam  Constitutional: She appears well-developed and well-nourished. No distress.  HENT:  Head: Normocephalic and atraumatic.  Right Ear: External ear normal.  Left Ear: External ear normal.  Eyes: Conjunctivae are normal. Right eye exhibits no discharge. Left eye exhibits no discharge. No scleral icterus.  Neck: Neck supple. No tracheal deviation present.  Cardiovascular: Normal rate, regular rhythm and intact distal pulses.  Pulmonary/Chest: Effort normal and breath sounds normal. No accessory muscle usage or stridor. No tachypnea. No respiratory distress. She has no wheezes. She has no rales. She exhibits bony tenderness (left chest wall, no breast ttp).  Abdominal: Soft. Bowel sounds are normal. She exhibits no distension. There is no tenderness. There is no rebound and no guarding.  Musculoskeletal: She exhibits no edema or tenderness.  Neurological: She is alert. She has normal strength. No cranial nerve deficit (no facial droop,  extraocular movements intact, no slurred speech) or sensory deficit. She exhibits normal muscle tone. She displays no seizure activity. Coordination normal.  Skin: Skin is warm and dry. No rash noted.  Psychiatric: She has a normal mood and affect.  Nursing note and vitals reviewed.    ED Treatments / Results  Labs (all labs ordered are listed, but only abnormal results are displayed) Labs Reviewed  I-STAT BETA HCG BLOOD, ED ([facility], [facility], [facility] ONLY) - Abnormal; Notable for the following components:      Result Value   I-stat hCG, quantitative 8.3 (*)    All other components within normal limits  BASIC METABOLIC PANEL  CBC  D-DIMER, QUANTITATIVE (NOT AT [facility])  I-STAT TROPONIN, ED    EKG  EKG Interpretation  Date/Time:  Wednesday September 23 2017 09:17:03 EST Ventricular Rate:  84 PR Interval:    QRS Duration: 77 QT Interval:  356 QTC Calculation: 421 R Axis:   88 Text Interpretation:  Sinus rhythm No significant change since last tracing Confirmed by Linwood DibblesKnapp, Daran Favaro [phone]([id number]) on 09/23/2017 9:27:43 AM       Radiology Dg Chest 2 View  Result Date: 09/23/2017 CLINICAL DATA:  O awakened with pain under the left breast associated with dizziness. The patient reports 6 weeks of cough with intermittent fever. Former smoker. EXAM: CHEST  2 VIEW COMPARISON:  None in PACs FINDINGS: The lungs are well-expanded and clear. The heart and pulmonary vascularity are normal. The mediastinum is normal in width. There is no pleural effusion. The bony thorax is unremarkable. IMPRESSION: There is no active cardiopulmonary disease. Electronically Signed   By: David  SwazilandJordan M.D.   On: 09/23/2017 09:38    Procedures Procedures (including critical care time)  Medications Ordered in ED Medications  HYDROcodone-acetaminophen (NORCO/VICODIN) 5-325 MG per tablet 1 tablet (not administered)  ketorolac (TORADOL) 30 MG/ML injection 30 mg (30 mg Intravenous Given 09/23/17 1013)     Initial Impression / Assessment  and Plan / ED Course  I have reviewed the triage vital signs and the nursing notes.  Pertinent labs & imaging results that were available during my care of the patient were reviewed by me and considered in my medical decision making (see chart for details).   Patient presents to the emergency room with complaints of sharp left-sided pleuritic chest pain.  She is low risk for PE.  D-dimer is negative.  I doubt pulmonary embolism.  Symptoms are atypical for cardiac disease.  EKG laboratory tests are reassuring.  We will discharge home with NSAIDs.  Follow-up with primary doctor.  Warning signs and precautions discussed  Final Clinical Impressions(s) / ED Diagnoses   Final diagnoses:  Pleurisy    ED Discharge Orders        Ordered    naproxen (NAPROSYN) 500 MG tablet  2 times daily with meals     09/23/17 1226       Linwood Dibbles, MD 09/23/17 1227

## 2017-09-23 NOTE — ED Triage Notes (Signed)
Patient reports left chest pain that woke her up out of her sleep this morning. Patient reports chest pain is under left breast and worse with cough and deep breathing. Denies productive cough

## 2019-09-09 ENCOUNTER — Encounter (HOSPITAL_COMMUNITY): Payer: Self-pay | Admitting: Emergency Medicine

## 2019-09-09 ENCOUNTER — Emergency Department (HOSPITAL_COMMUNITY): Payer: Self-pay

## 2019-09-09 ENCOUNTER — Other Ambulatory Visit: Payer: Self-pay

## 2019-09-09 ENCOUNTER — Emergency Department (HOSPITAL_COMMUNITY)
Admission: EM | Admit: 2019-09-09 | Discharge: 2019-09-09 | Disposition: A | Discharge status: Home / Self Care | Payer: Self-pay | Attending: Emergency Medicine | Admitting: Emergency Medicine

## 2019-09-09 DIAGNOSIS — Z87891 Personal history of nicotine dependence: Secondary | ICD-10-CM | POA: Insufficient documentation

## 2019-09-09 DIAGNOSIS — N83201 Unspecified ovarian cyst, right side: Secondary | ICD-10-CM | POA: Insufficient documentation

## 2019-09-09 DIAGNOSIS — R1011 Right upper quadrant pain: Secondary | ICD-10-CM | POA: Insufficient documentation

## 2019-09-09 DIAGNOSIS — N83209 Unspecified ovarian cyst, unspecified side: Secondary | ICD-10-CM

## 2019-09-09 LAB — CBC
HCT: 42.5 % (ref 36.0–46.0)
Hemoglobin: 14.5 g/dL (ref 12.0–15.0)
MCH: 33 pg (ref 26.0–34.0)
MCHC: 34.1 g/dL (ref 30.0–36.0)
MCV: 96.6 fL (ref 80.0–100.0)
Platelets: 237 10*3/uL (ref 150–400)
RBC: 4.4 MIL/uL (ref 3.87–5.11)
RDW: 12 % (ref 11.5–15.5)
WBC: 10.8 10*3/uL — ABNORMAL HIGH (ref 4.0–10.5)
nRBC: 0 % (ref 0.0–0.2)

## 2019-09-09 LAB — TROPONIN I (HIGH SENSITIVITY)
Troponin I (High Sensitivity): <2 ng/L (ref <18)
Troponin I (High Sensitivity): <2 ng/L (ref <18)

## 2019-09-09 LAB — BASIC METABOLIC PANEL
Anion gap: 9 (ref 5–15)
BUN: 11 mg/dL (ref 6–20)
CO2: 23 mmol/L (ref 22–32)
Calcium: 9.5 mg/dL (ref 8.9–10.3)
Chloride: 104 mmol/L (ref 98–111)
Creatinine, Ser: 0.55 mg/dL (ref 0.44–1.00)
GFR calc Af Amer: >60 mL/min (ref >60)
GFR calc non Af Amer: >60 mL/min (ref >60)
Glucose, Bld: 115 mg/dL — ABNORMAL HIGH (ref 70–99)
Potassium: 3.8 mmol/L (ref 3.5–5.1)
Sodium: 136 mmol/L (ref 135–145)

## 2019-09-09 LAB — HEPATIC FUNCTION PANEL
ALT: 34 U/L (ref 0–44)
AST: 24 U/L (ref 15–41)
Albumin: 3.7 g/dL (ref 3.5–5.0)
Alkaline Phosphatase: 48 U/L (ref 38–126)
Bilirubin, Direct: 0.1 mg/dL (ref 0.0–0.2)
Indirect Bilirubin: 0.7 mg/dL (ref 0.3–0.9)
Total Bilirubin: 0.8 mg/dL (ref 0.3–1.2)
Total Protein: 6.4 g/dL — ABNORMAL LOW (ref 6.5–8.1)

## 2019-09-09 LAB — LIPASE, BLOOD: Lipase: 40 U/L (ref 11–51)

## 2019-09-09 LAB — I-STAT BETA HCG BLOOD, ED (MC, WL, AP ONLY): I-stat hCG, quantitative: <5 m[IU]/mL (ref <5)

## 2019-09-09 MED ORDER — ALUM & MAG HYDROXIDE-SIMETH 200-200-20 MG/5ML PO SUSP
30.0000 mL | Freq: Once | ORAL | Status: AC
  Administered 2019-09-09: 30 mL via ORAL
  Filled 2019-09-09: qty 30

## 2019-09-09 MED ORDER — LIDOCAINE VISCOUS HCL 2 % MT SOLN
15.0000 mL | Freq: Once | OROMUCOSAL | Status: AC
  Administered 2019-09-09: 15 mL via ORAL
  Filled 2019-09-09: qty 15

## 2019-09-09 MED ORDER — ONDANSETRON HCL 4 MG/2ML IJ SOLN
4.0000 mg | Freq: Once | INTRAMUSCULAR | Status: AC
  Administered 2019-09-09: 4 mg via INTRAVENOUS
  Filled 2019-09-09: qty 2

## 2019-09-09 MED ORDER — MORPHINE SULFATE (PF) 4 MG/ML IV SOLN
4.0000 mg | Freq: Once | INTRAVENOUS | Status: AC
  Administered 2019-09-09: 4 mg via INTRAVENOUS
  Filled 2019-09-09: qty 1

## 2019-09-09 MED ORDER — HYDROCODONE-ACETAMINOPHEN 5-325 MG PO TABS: 1.0000 | ORAL_TABLET | ORAL | 0 refills | Status: DC | PRN

## 2019-09-09 MED ORDER — IOHEXOL 300 MG/ML  SOLN
100.0000 mL | Freq: Once | INTRAMUSCULAR | Status: AC | PRN
  Administered 2019-09-09: 100 mL via INTRAVENOUS

## 2019-09-09 MED ORDER — SODIUM CHLORIDE 0.9 % IV BOLUS
1000.0000 mL | Freq: Once | INTRAVENOUS | Status: AC
  Administered 2019-09-09: 1000 mL via INTRAVENOUS

## 2019-09-09 MED ORDER — SODIUM CHLORIDE 0.9% FLUSH: 3.0000 mL | Freq: Once | INTRAVENOUS | Status: DC

## 2019-09-09 NOTE — ED Provider Notes (Signed)
[facility] EMERGENCY DEPARTMENT Provider Note   CSN: [id number] Arrival date & time: 09/09/19  1324     History   Chief Complaint Chief Complaint  Patient presents with  . Chest Pain    HPI [name] is a 33 y.o. female.     33 year old female with complaint of epigastric and right upper quadrant pain onset 2 days ago.  Pain is been worse since eating soup yesterday, associated with nausea, vomiting, chills.  Denies changes in bowel or bladder habits.  Pain radiates to her right side and in between her scapula.  Denies blood in her emesis or stools.  Patient reports taking Motrin without improvement, does not regularly take NSAID medications, no history of peptic ulcer disease, no history of similar pain previously.  No other complaints or concerns.     Past Medical History:  Diagnosis Date  . Allergy   . Anxiety   . Dysautonomia (HCC) 01/03/2013  . History of chicken pox   . MIGRAINES, HX OF 02/02/2007   Qualifier: Diagnosis of  By: Hetty Ely MD, Franne Grip   . Syncope 01/03/2013    Patient Active Problem List   Diagnosis Date Noted  . Dysautonomia (HCC) 01/03/2013  . Anxiety 01/03/2013  . MIGRAINES, HX OF 02/02/2007    Past Surgical History:  Procedure Laterality Date  . none       OB History   No obstetric history on file.      Home Medications    Prior to Admission medications   Medication Sig Start Date End Date Taking? Authorizing Provider  HYDROcodone-acetaminophen (NORCO/VICODIN) 5-325 MG tablet Take 1 tablet by mouth every 4 (four) hours as needed. 09/09/19   Jeannie Fend, PA-C    Family History Family History  Problem Relation Age of Onset  . Arrhythmia Mother   . Cervical cancer Mother   . Diabetes Maternal Grandmother   . Diabetes Maternal Grandfather     Social History Social History   Tobacco Use  . Smoking status: Former Games developer  . Smokeless tobacco: Never Used  Substance Use Topics  . Alcohol use:  Yes    Comment: occasional  . Drug use: No     Allergies   Tramadol   Review of Systems Review of Systems  Constitutional: Positive for chills. Negative for diaphoresis and fever.  Respiratory: Negative for shortness of breath.   Cardiovascular: Negative for chest pain.  Gastrointestinal: Positive for abdominal pain, nausea and vomiting. Negative for constipation and diarrhea.  Genitourinary: Negative for dysuria, frequency, urgency and vaginal discharge.  Musculoskeletal: Positive for back pain. Negative for myalgias.  Skin: Negative for rash and wound.  Allergic/Immunologic: Negative for immunocompromised state.  Neurological: Negative for weakness.  Hematological: Negative for adenopathy. Does not bruise/bleed easily.  Psychiatric/Behavioral: Negative for confusion.  All other systems reviewed and are negative.    Physical Exam Updated Vital Signs BP 116/83 (BP Location: Right Arm)   Pulse 72   Temp 98 F (36.7 C) (Oral)   Resp 16   Ht 5\' 8"  (1.727 m)   Wt 65.3 kg   SpO2 100%   BMI 21.90 kg/m   Physical Exam Vitals signs and nursing note reviewed.  Constitutional:      General: She is not in acute distress.    Appearance: She is well-developed. She is not diaphoretic.  HENT:     Head: Normocephalic and atraumatic.  Cardiovascular:     Rate and Rhythm: Normal rate and regular rhythm.  Heart sounds: Normal heart sounds.  Pulmonary:     Effort: Pulmonary effort is normal.     Breath sounds: Normal breath sounds. No decreased breath sounds.  Chest:     Chest wall: No tenderness.  Abdominal:     Palpations: Abdomen is soft.     Tenderness: There is abdominal tenderness in the right upper quadrant and epigastric area. There is no right CVA tenderness or left CVA tenderness.    Musculoskeletal:     Right lower leg: She exhibits no tenderness. No edema.     Left lower leg: She exhibits no tenderness. No edema.  Skin:    General: Skin is warm and dry.   Neurological:     Mental Status: She is alert and oriented to person, place, and time.  Psychiatric:        Behavior: Behavior normal.      ED Treatments / Results  Labs (all labs ordered are listed, but only abnormal results are displayed) Labs Reviewed  BASIC METABOLIC PANEL - Abnormal; Notable for the following components:      Result Value   Glucose, Bld 115 (*)    All other components within normal limits  CBC - Abnormal; Notable for the following components:   WBC 10.8 (*)    All other components within normal limits  HEPATIC FUNCTION PANEL - Abnormal; Notable for the following components:   Total Protein 6.4 (*)    All other components within normal limits  LIPASE, BLOOD  I-STAT BETA HCG BLOOD, ED ([facility], [facility], [facility] ONLY)  TROPONIN I (HIGH SENSITIVITY)  TROPONIN I (HIGH SENSITIVITY)    EKG None  Radiology Dg Chest 2 View  Result Date: 09/09/2019 CLINICAL DATA:  Chest pain radiating to back. EXAM: CHEST - 2 VIEW COMPARISON:  09/23/2017 FINDINGS: The heart size and mediastinal contours are within normal limits. Both lungs are clear. The visualized skeletal structures are unremarkable. IMPRESSION: No active cardiopulmonary disease. Electronically Signed   By: Zetta Bills M.D.   On: 09/09/2019 09:49   Ct Abdomen Pelvis W Contrast  Result Date: 09/09/2019 CLINICAL DATA:  Generalized abdominal pain EXAM: CT ABDOMEN AND PELVIS WITH CONTRAST TECHNIQUE: Multidetector CT imaging of the abdomen and pelvis was performed using the standard protocol following bolus administration of intravenous contrast. CONTRAST:  159mL OMNIPAQUE IOHEXOL 300 MG/ML  SOLN COMPARISON:  Ultrasound right upper quadrant September 09, 2019 FINDINGS: Lower chest: There is no lung base edema or consolidation. Hepatobiliary: There is fatty infiltration near the fissure for the ligamentum teres. No focal liver lesions are evident. The gallbladder wall is not appreciably thickened. There is no biliary duct  dilatation. Pancreas: There is no pancreatic mass or inflammatory focus. Spleen: No splenic lesions are evident. Adrenals/Urinary Tract: Adrenals bilaterally appear normal. Kidneys bilaterally show no evident mass or hydronephrosis on either side. There is no demonstrable renal or ureteral calculus on either side. Urinary bladder is midline with wall thickness within normal limits. Stomach/Bowel: There is no appreciable bowel wall or mesenteric thickening. There is no evident bowel obstruction. There is moderate stool throughout the colon. The terminal ileum appears unremarkable. There is no evident free air or portal venous air. Vascular/Lymphatic: There is no abdominal aortic aneurysm. No vascular lesions are demonstrable. 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 There is no evident adenopathy in the abdomen or pelvis. Reproductive: The uterus is anteverted. Intrauterine device is positioned within the endometrium. There is a collapsed cyst in the right ovary measuring 1.7  x 0.9 cm. No other evident pelvic mass. There is a small amount of free fluid in the cul-de-sac. Other: Appendix appears unremarkable. There is no abscess in the abdomen or pelvis. There is no ascites beyond the mild fluid in the cul-de-sac. Musculoskeletal: There are no blastic or lytic bone lesions. There is no intramuscular or abdominal wall lesion. IMPRESSION: 1. There is evidence of ovarian cyst rupture on the right with fluid tracking into the cul-de-sac, likely indicative of fluid from recent ovarian cyst rupture. 2.  Intrauterine device positioned within the endometrium. 3. No bowel obstruction. No abscess in the abdomen or pelvis. Appendix appears normal. 4. No evident renal or ureteral calculus. No hydronephrosis. Urinary bladder wall thickness within normal limits. Electronically Signed   By: Bretta Bang III M.D.   On: 09/09/2019 18:30   US Abdomen Limited  Result Date: 09/09/2019 CLINICAL DATA:  Right  upper quadrant and epigastric pain for 2 days. EXAM: ULTRASOUND ABDOMEN LIMITED RIGHT UPPER QUADRANT COMPARISON:  None. FINDINGS: Gallbladder: No gallstones or wall thickening visualized. No sonographic Murphy sign noted by sonographer. Common bile duct: Diameter: 5 mm Liver: No focal lesion identified. Within normal limits in parenchymal echogenicity. Portal vein is patent on color Doppler imaging with normal direction of blood flow towards the liver. Other: None. IMPRESSION: Normal right upper quadrant ultrasound. Electronically Signed   By: Amie Portland M.D.   On: 09/09/2019 14:56    Procedures Procedures (including critical care time)  Medications Ordered in ED Medications  sodium chloride flush (NS) 0.9 % injection 3 mL (3 mLs Intravenous Not Given 09/09/19 1513)  sodium chloride 0.9 % bolus 1,000 mL (0 mLs Intravenous Stopped 09/09/19 1625)  ondansetron (ZOFRAN) injection 4 mg (4 mg Intravenous Given 09/09/19 1424)  morphine 4 MG/ML injection 4 mg (4 mg Intravenous Given 09/09/19 1424)  alum & mag hydroxide-simeth (MAALOX/MYLANTA) 200-200-20 MG/5ML suspension 30 mL (30 mLs Oral Given 09/09/19 1526)    And  lidocaine (XYLOCAINE) 2 % viscous mouth solution 15 mL (15 mLs Oral Given 09/09/19 1527)  ondansetron (ZOFRAN) injection 4 mg (4 mg Intravenous Given 09/09/19 1648)  morphine 4 MG/ML injection 4 mg (4 mg Intravenous Given 09/09/19 1648)  iohexol (OMNIPAQUE) 300 MG/ML solution 100 mL (100 mLs Intravenous Contrast Given 09/09/19 1809)     Initial Impression / Assessment and Plan / ED Course  I have reviewed the triage vital signs and the nursing notes.  Pertinent labs & imaging results that were available during my care of the patient were reviewed by me and considered in my medical decision making (see chart for details).  Clinical Course as of Sep 09 1843  Fri Sep 09, 2019  5040 33 year old female with complaint of epigastric and right upper quadrant pain for the past 2 days  associated with nausea, vomiting, chills.  No changes in bowel or bladder habits.  On exam patient has tenderness in her right upper quadrant epigastric area, negative Murphy sign.  Ultrasound right upper quadrant is unremarkable.  CBC and CMP without significant changes, hCG negative.  Patient had a troponin completed due to report of chest pain in triage and this is negative.  Lipase is within normal limits. She continued to have ongoing pain without explanation for her pain, work-up was followed by a CT abdomen and pelvis which shows a ruptured right ovarian cyst.  Patient be discharged with prescription for Norco to follow-up with her PCP, return to ER for new or worsening symptoms.   [LM]  Clinical Course User Index [LM] Jeannie FendMurphy, Laura A, PA-C       Final Clinical Impressions(s) / ED Diagnoses   Final diagnoses:  Right upper quadrant abdominal pain  Ruptured ovarian cyst    ED Discharge Orders         Ordered    HYDROcodone-acetaminophen (NORCO/VICODIN) 5-325 MG tablet  Every 4 hours PRN     09/09/19 1843           Jeannie FendMurphy, Laura A, PA-C 09/09/19 1844    Sabas SousBero, Michael M, MD 09/10/19 [phone]

## 2019-09-09 NOTE — ED Triage Notes (Signed)
Pt reports chest pain that radiates down under her R breast and into her back, pain began Wednesday night. Pt reports she has not been able to eat due to vomiting. Concerned it may be her gallbladder.

## 2019-09-09 NOTE — ED Notes (Signed)
Patient transported to [facility] 

## 2019-09-09 NOTE — Discharge Instructions (Addendum)
Take Norco as needed as prescribed for pain.  Do not drive operate machinery if taking Norco.  Follow-up with your primary care provider for recheck, return to the emergency room for worsening or concerning symptoms.

## 2020-08-23 IMAGING — CT CT ABD-PELV W/ CM
2 of 4 series · 14 of 46 positions shown, 16 images · IV contrast (APPLIED)
Comparison: Ultrasound right upper quadrant September 09, 2019

CLINICAL DATA: Generalized abdominal pain

EXAM:
CT ABDOMEN AND PELVIS WITH CONTRAST
TECHNIQUE: Multidetector CT imaging of the abdomen and pelvis was performed
using the standard protocol following bolus administration of
intravenous contrast.
CONTRAST:  100mL OMNIPAQUE IOHEXOL 300 MG/ML  SOLN

[Series 3: abdomen 5.0 · axial · 0.82mm/px · z∈[-255,+190]mm · 11 of 101 slices shown, 13 images]
[im 6/101  soft-tissue]
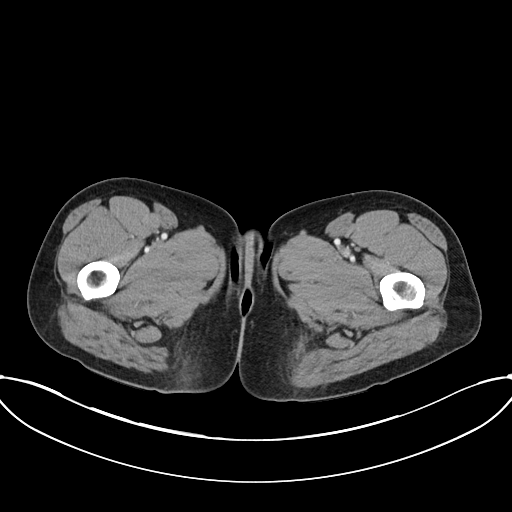
[im 6/101  bone]
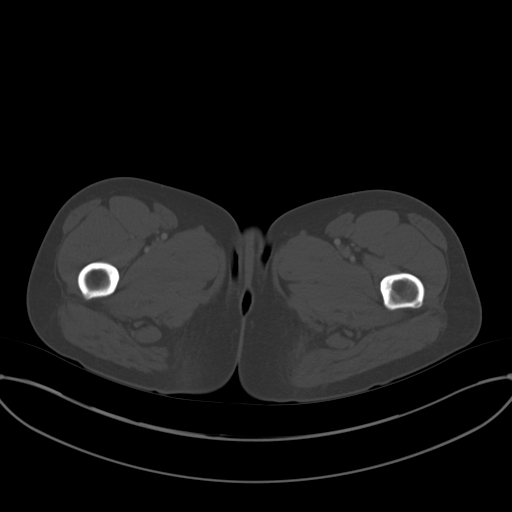
[im 16/101  soft-tissue]
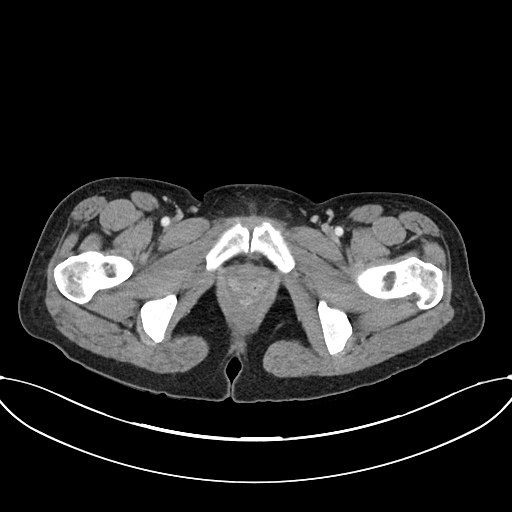
[im 27/101  soft-tissue]
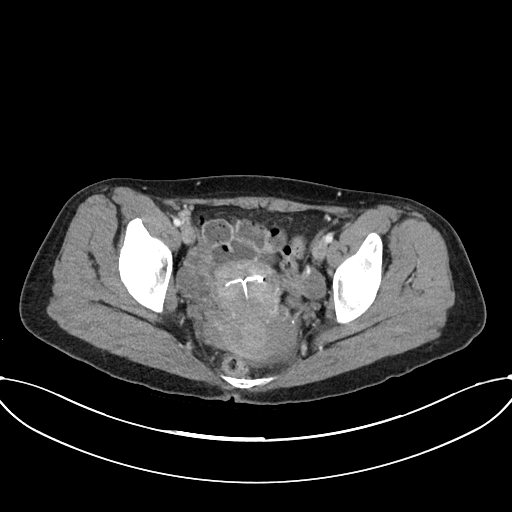
[im 32/101  soft-tissue]
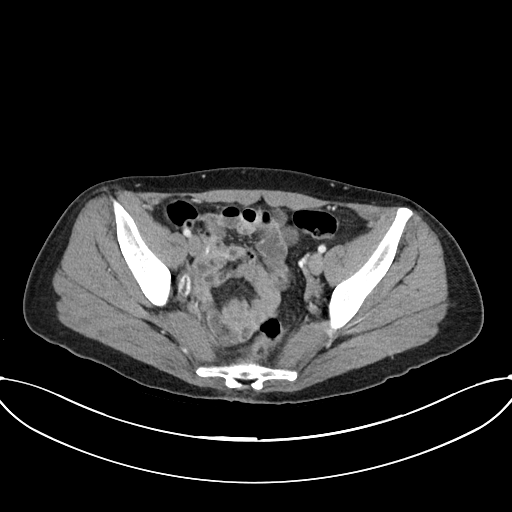
[im 43/101  soft-tissue]
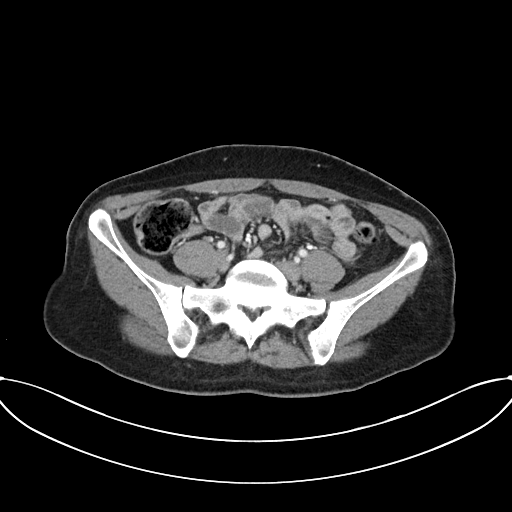
[im 53/101  soft-tissue]
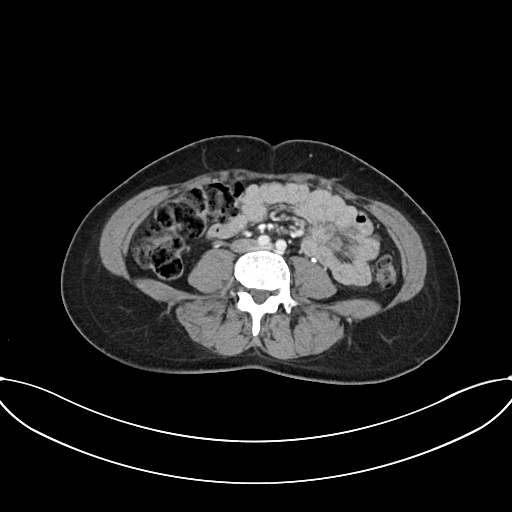
[im 58/101  soft-tissue]
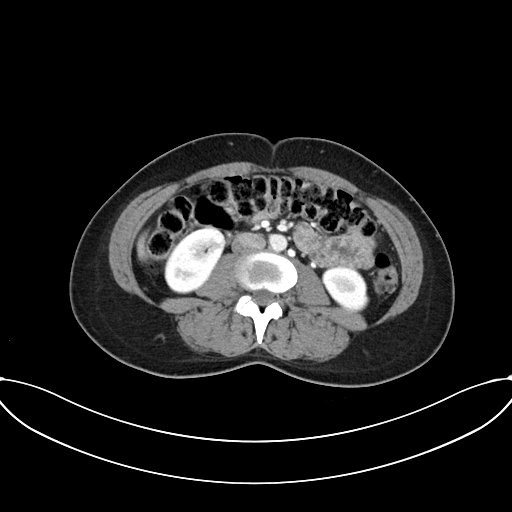
[im 69/101  soft-tissue]
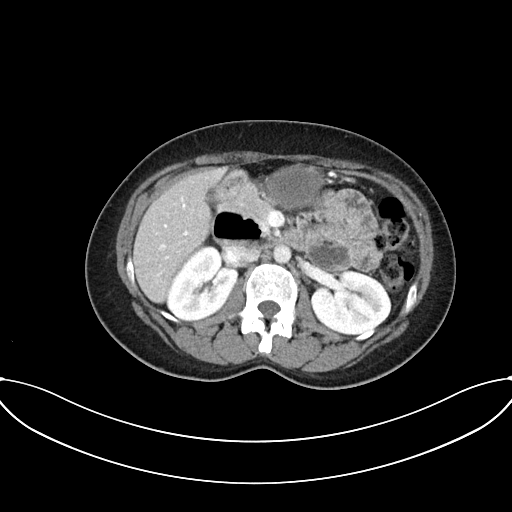
[im 74/101  soft-tissue]
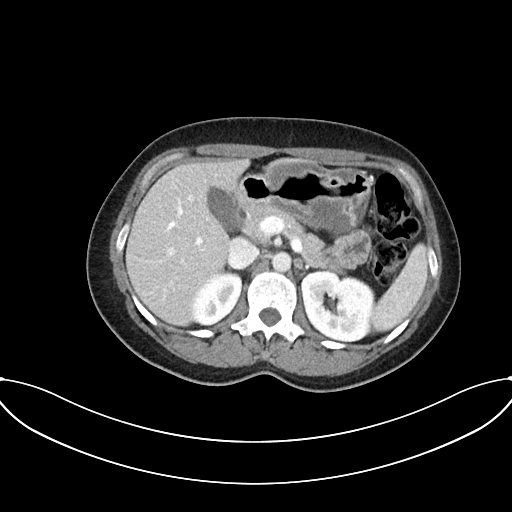
[im 74/101  bone]
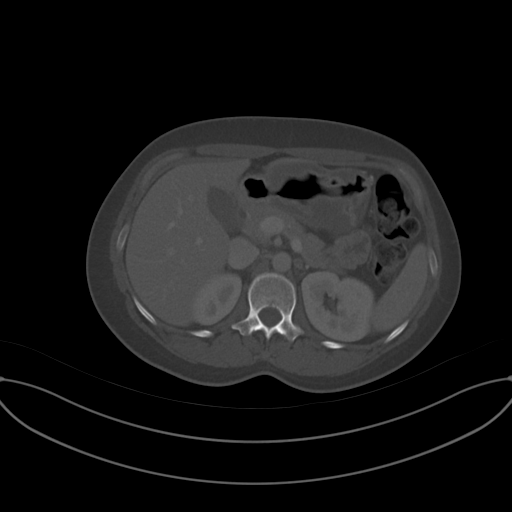
[im 85/101  soft-tissue]
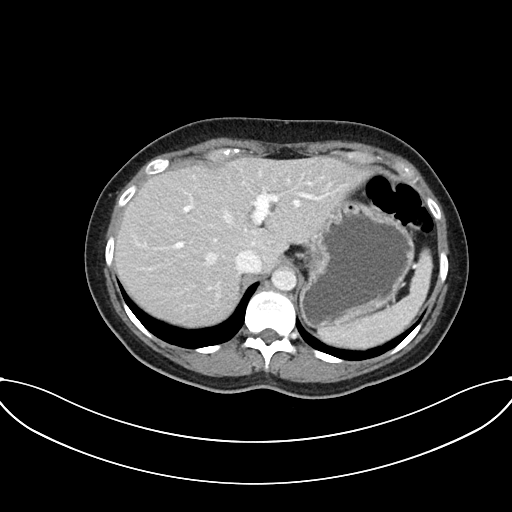
[im 95/101  soft-tissue]
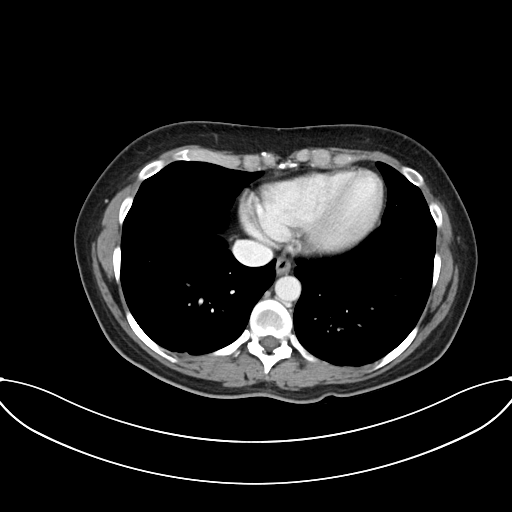

[Series 6: abdomen 3.0 mpr cor · coronal · 0.64mm/px · 3 of 81 slices shown]
[im 27/81  soft-tissue]
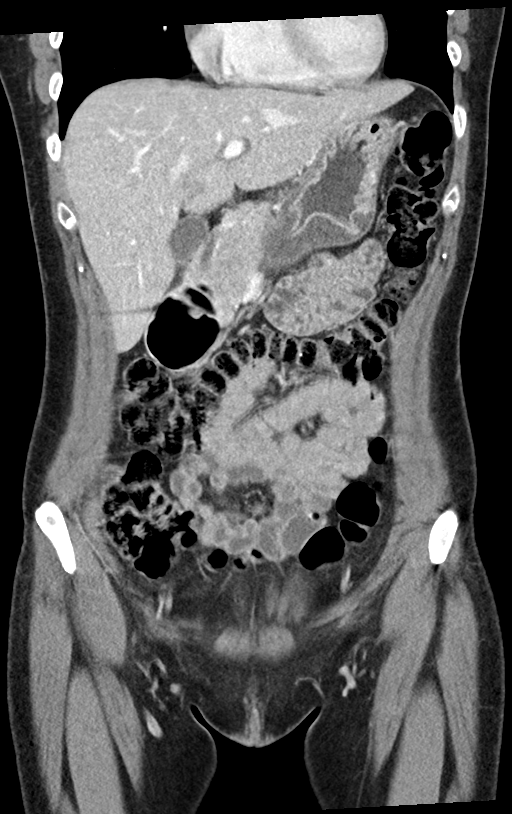
[im 36/81  soft-tissue]
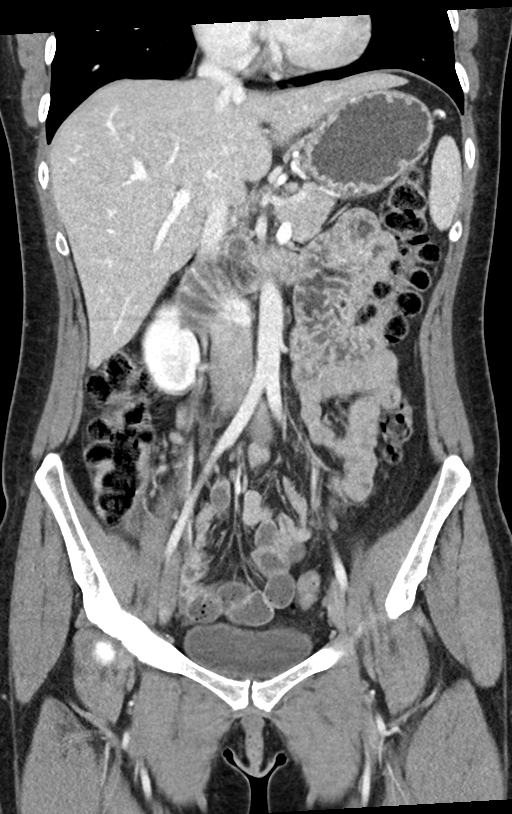
[im 45/81  soft-tissue]
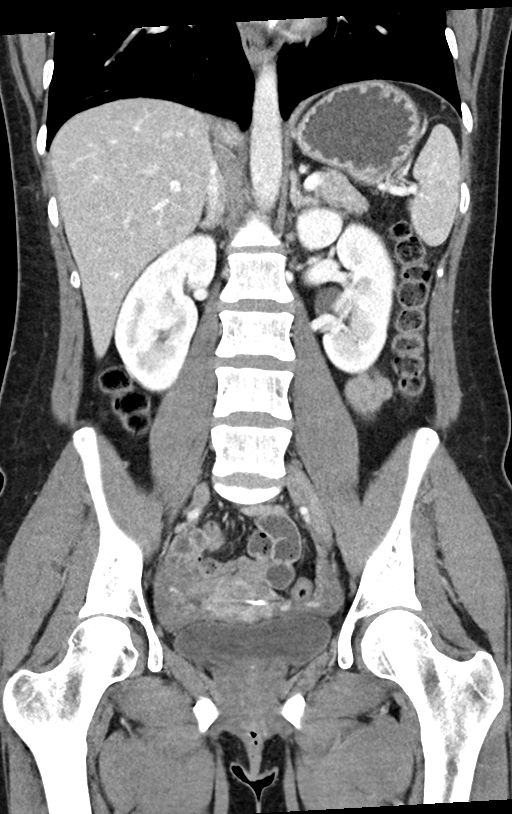

[14 of 46 positions shown; findings below may reference images not displayed]

FINDINGS: Lower chest: There is no lung base edema or consolidation.

Hepatobiliary: There is fatty infiltration near the fissure for the
ligamentum teres. No focal liver lesions are evident. The
gallbladder wall is not appreciably thickened. There is no biliary
duct dilatation.

Pancreas: There is no pancreatic mass or inflammatory focus.

Spleen: No splenic lesions are evident.

Adrenals/Urinary Tract: Adrenals bilaterally appear normal. Kidneys
bilaterally show no evident mass or hydronephrosis on either side.
There is no demonstrable renal or ureteral calculus on either side.
Urinary bladder is midline with wall thickness within normal limits.

Stomach/Bowel: There is no appreciable bowel wall or mesenteric
thickening. There is no evident bowel obstruction. There is moderate
stool throughout the colon. The terminal ileum appears unremarkable.
There is no evident free air or portal venous air.

Vascular/Lymphatic: There is no abdominal aortic aneurysm. No
vascular lesions are demonstrable. There is a retroaortic left renal
vein, an anatomic variant. There is no evident adenopathy in the
abdomen or pelvis.

Reproductive: The uterus is anteverted. Intrauterine device is
positioned within the endometrium. There is a collapsed cyst in the
right ovary measuring 1.7 x 0.9 cm. No other evident pelvic mass.
There is a small amount of free fluid in the cul-de-sac.

Other: Appendix appears unremarkable. There is no abscess in the
abdomen or pelvis. There is no ascites beyond the mild fluid in the
cul-de-sac.

Musculoskeletal: There are no blastic or lytic bone lesions. There
is no intramuscular or abdominal wall lesion.
IMPRESSION: 1. There is evidence of ovarian cyst rupture on the right with fluid
tracking into the cul-de-sac, likely indicative of fluid from recent
ovarian cyst rupture.

2.  Intrauterine device positioned within the endometrium.

3. No bowel obstruction. No abscess in the abdomen or pelvis.
Appendix appears normal.

4. No evident renal or ureteral calculus. No hydronephrosis. Urinary
bladder wall thickness within normal limits.

## 2023-05-22 ENCOUNTER — Other Ambulatory Visit: Payer: Self-pay

## 2023-05-22 ENCOUNTER — Encounter (HOSPITAL_BASED_OUTPATIENT_CLINIC_OR_DEPARTMENT_OTHER): Payer: Self-pay

## 2023-05-22 ENCOUNTER — Emergency Department (HOSPITAL_BASED_OUTPATIENT_CLINIC_OR_DEPARTMENT_OTHER): Admission: EM | Admit: 2023-05-22 | Payer: Medicaid Other | Source: Home / Self Care

## 2023-05-22 DIAGNOSIS — S71112A Laceration without foreign body, left thigh, initial encounter: Secondary | ICD-10-CM | POA: Insufficient documentation

## 2023-05-22 DIAGNOSIS — W268XXA Contact with other sharp object(s), not elsewhere classified, initial encounter: Secondary | ICD-10-CM | POA: Diagnosis not present

## 2023-05-22 DIAGNOSIS — S79922A Unspecified injury of left thigh, initial encounter: Secondary | ICD-10-CM | POA: Diagnosis present

## 2023-05-22 DIAGNOSIS — S81812A Laceration without foreign body, left lower leg, initial encounter: Secondary | ICD-10-CM
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
## 2023-05-22 NOTE — ED Notes (Signed)
Wound irrigated with sterile water and wound cleanser.

## 2023-05-23 MED ORDER — LIDOCAINE HCL 2 % IJ SOLN
INTRAMUSCULAR | Status: AC
  Filled 2023-05-23: qty 20

## 2023-05-23 NOTE — ED Notes (Signed)
Pt ambulated without assistance to the bathroom.
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
## 2023-10-26 ENCOUNTER — Emergency Department (HOSPITAL_BASED_OUTPATIENT_CLINIC_OR_DEPARTMENT_OTHER)
Admission: EM | Admit: 2023-10-26 | Discharge: 2023-10-26 | Disposition: A | Discharge status: Home / Self Care | Payer: Medicaid Other

## 2023-10-26 ENCOUNTER — Encounter (HOSPITAL_BASED_OUTPATIENT_CLINIC_OR_DEPARTMENT_OTHER): Payer: Self-pay | Admitting: Emergency Medicine

## 2023-10-26 ENCOUNTER — Emergency Department (HOSPITAL_BASED_OUTPATIENT_CLINIC_OR_DEPARTMENT_OTHER): Payer: Medicaid Other | Admitting: Radiology

## 2023-10-26 ENCOUNTER — Other Ambulatory Visit: Payer: Self-pay

## 2023-10-26 DIAGNOSIS — R059 Cough, unspecified: Secondary | ICD-10-CM | POA: Diagnosis not present

## 2023-10-26 DIAGNOSIS — R109 Unspecified abdominal pain: Secondary | ICD-10-CM | POA: Insufficient documentation

## 2023-10-26 DIAGNOSIS — Z20822 Contact with and (suspected) exposure to covid-19: Secondary | ICD-10-CM | POA: Diagnosis not present

## 2023-10-26 DIAGNOSIS — R0781 Pleurodynia: Secondary | ICD-10-CM | POA: Insufficient documentation

## 2023-10-26 DIAGNOSIS — R0789 Other chest pain: Secondary | ICD-10-CM | POA: Insufficient documentation

## 2023-10-26 DIAGNOSIS — R079 Chest pain, unspecified: Secondary | ICD-10-CM | POA: Diagnosis present

## 2023-10-26 LAB — COMPREHENSIVE METABOLIC PANEL
ALT: 32 U/L (ref 0–44)
AST: 27 U/L (ref 15–41)
Albumin: 4.6 g/dL (ref 3.5–5.0)
Alkaline Phosphatase: 63 U/L (ref 38–126)
Anion gap: 9 (ref 5–15)
BUN: 6 mg/dL (ref 6–20)
CO2: 28 mmol/L (ref 22–32)
Calcium: 10.1 mg/dL (ref 8.9–10.3)
Chloride: 102 mmol/L (ref 98–111)
Creatinine, Ser: 0.53 mg/dL (ref 0.44–1.00)
GFR, Estimated: >60 mL/min (ref >60)
Glucose, Bld: 90 mg/dL (ref 70–99)
Potassium: 3.2 mmol/L — ABNORMAL LOW (ref 3.5–5.1)
Sodium: 139 mmol/L (ref 135–145)
Total Bilirubin: 0.6 mg/dL (ref <1.2)
Total Protein: 7.9 g/dL (ref 6.5–8.1)

## 2023-10-26 LAB — RESP PANEL BY RT-PCR (RSV, FLU A&B, COVID)  RVPGX2
Influenza A by PCR: NEGATIVE
Influenza B by PCR: NEGATIVE
Resp Syncytial Virus by PCR: NEGATIVE
SARS Coronavirus 2 by RT PCR: NEGATIVE

## 2023-10-26 LAB — CBC WITH DIFFERENTIAL/PLATELET
Abs Immature Granulocytes: 0.02 10*3/uL (ref 0.00–0.07)
Basophils Absolute: 0 10*3/uL (ref 0.0–0.1)
Basophils Relative: 0 %
Eosinophils Absolute: 0.1 10*3/uL (ref 0.0–0.5)
Eosinophils Relative: 2 %
HCT: 42 % (ref 36.0–46.0)
Hemoglobin: 14.1 g/dL (ref 12.0–15.0)
Immature Granulocytes: 0 %
Lymphocytes Relative: 18 %
Lymphs Abs: 1.4 10*3/uL (ref 0.7–4.0)
MCH: 32 pg (ref 26.0–34.0)
MCHC: 33.6 g/dL (ref 30.0–36.0)
MCV: 95.5 fL (ref 80.0–100.0)
Monocytes Absolute: 0.8 10*3/uL (ref 0.1–1.0)
Monocytes Relative: 10 %
Neutro Abs: 5.5 10*3/uL (ref 1.7–7.7)
Neutrophils Relative %: 70 %
Platelets: 318 10*3/uL (ref 150–400)
RBC: 4.4 MIL/uL (ref 3.87–5.11)
RDW: 12.1 % (ref 11.5–15.5)
WBC: 7.9 10*3/uL (ref 4.0–10.5)
nRBC: 0 % (ref 0.0–0.2)

## 2023-10-26 LAB — URINALYSIS, ROUTINE W REFLEX MICROSCOPIC
Bilirubin Urine: NEGATIVE
Glucose, UA: NEGATIVE mg/dL
Hgb urine dipstick: NEGATIVE
Ketones, ur: NEGATIVE mg/dL
Leukocytes,Ua: NEGATIVE
Nitrite: NEGATIVE
Protein, ur: NEGATIVE mg/dL
Specific Gravity, Urine: <1.005 — ABNORMAL LOW (ref 1.005–1.030)
pH: 6.5 (ref 5.0–8.0)

## 2023-10-26 LAB — D-DIMER, QUANTITATIVE: D-Dimer, Quant: 0.39 ug{FEU}/mL (ref 0.00–0.50)

## 2023-10-26 LAB — LIPASE, BLOOD: Lipase: 23 U/L (ref 11–51)

## 2023-10-26 MED ORDER — HYDROCODONE-ACETAMINOPHEN 5-325 MG PO TABS: 1.0000 | ORAL_TABLET | ORAL | 0 refills | Status: AC | PRN

## 2023-10-26 MED ORDER — IBUPROFEN 800 MG PO TABS: 800.0000 mg | ORAL_TABLET | Freq: Three times a day (TID) | ORAL | 0 refills | Status: AC | PRN

## 2023-10-26 MED ORDER — ALBUTEROL SULFATE HFA 108 (90 BASE) MCG/ACT IN AERS: 2.0000 | INHALATION_SPRAY | RESPIRATORY_TRACT | Status: DC | PRN

## 2023-10-26 NOTE — ED Notes (Signed)
Discharge paperwork given and verbally understood. 

## 2023-10-26 NOTE — ED Triage Notes (Signed)
Pt caox4, ambulatory c/o R flank/rib pain with nonproductive cough, pain on respiration, and SOB since Sat. Pt denies N/V/D, diarrhea, fever.

## 2023-10-27 NOTE — ED Provider Notes (Signed)
[place] EMERGENCY DEPARTMENT AT [facility] Provider Note   CSN: [id number] Arrival date & time: 10/26/23  1302     History  Chief Complaint  Patient presents with   Flank Pain    [name] is a 37 y.o. female.  Patient complains of pain.  Patient reports she began having pain 3 days ago.  Patient reports that the pain has gotten worse.  Patient points to her right lower rib area as the area of pain.  Patient reports pain radiates to her back.  Patient has not had any fever she has not had any chills.  She denies any abdominal pain.  Patient reports that she has had an occasional cough.  Patient has pain when she coughs.  Patient denies any nausea or vomiting she has not had any diarrhea.  The history is provided by the patient. No language interpreter was used.  Flank Pain Associated symptoms include chest pain.       Home Medications Prior to Admission medications   Medication Sig Start Date End Date Taking? Authorizing Provider  HYDROcodone-acetaminophen (NORCO/VICODIN) 5-325 MG tablet Take 1 tablet by mouth every 4 (four) hours as needed for moderate pain (pain score 4-6). 10/26/23 10/25/24 Yes Elson Areas, PA-C  ibuprofen (ADVIL) 800 MG tablet Take 1 tablet (800 mg total) by mouth every 8 (eight) hours as needed. 10/26/23  Yes Elson Areas, PA-C      Allergies    Tramadol    Review of Systems   Review of Systems  Cardiovascular:  Positive for chest pain.  Genitourinary:  Positive for flank pain.  All other systems reviewed and are negative.   Physical Exam Updated Vital Signs BP 110/86   Pulse 96   Temp 97.7 F (36.5 C) (Oral)   Resp (!) 22   Ht 5\' 8"  (1.727 m)   Wt 65.8 kg   SpO2 100%   BMI 22.05 kg/m  Physical Exam Vitals and nursing note reviewed.  Constitutional:      Appearance: She is well-developed.  HENT:     Head: Normocephalic.  Cardiovascular:     Rate and Rhythm: Normal rate and regular rhythm.     Comments: Tender  right rib area, abdomen is soft nontender there is no gallbladder tenderness no abdominal tenderness. Pulmonary:     Effort: Pulmonary effort is normal.  Abdominal:     General: There is no distension.  Musculoskeletal:        General: Normal range of motion.     Cervical back: Normal range of motion.  Skin:    General: Skin is warm.  Neurological:     General: No focal deficit present.     Mental Status: She is alert and oriented to person, place, and time.  Psychiatric:        Mood and Affect: Mood normal.     ED Results / Procedures / Treatments   Labs (all labs ordered are listed, but only abnormal results are displayed) Labs Reviewed  COMPREHENSIVE METABOLIC PANEL - Abnormal; Notable for the following components:      Result Value   Potassium 3.2 (*)    All other components within normal limits  URINALYSIS, ROUTINE W REFLEX MICROSCOPIC - Abnormal; Notable for the following components:   Color, Urine COLORLESS (*)    Specific Gravity, Urine <1.005 (*)    All other components within normal limits  RESP PANEL BY RT-PCR (RSV, FLU A&B, COVID)  [id number]  CBC WITH DIFFERENTIAL/PLATELET  LIPASE, BLOOD  D-DIMER, QUANTITATIVE    EKG EKG Interpretation Date/Time:  Monday October 26 2023 13:12:06 EST Ventricular Rate:  100 PR Interval:  156 QRS Duration:  82 QT Interval:  350 QTC Calculation: 451 R Axis:   74  Text Interpretation: Normal sinus rhythm Normal ECG When compared with ECG of 09-Sep-2019 09:27, No significant change was found Confirmed by Susy Frizzle [phone]) on 10/27/2023 8:45:04 AM  Radiology DG Chest 2 View  Result Date: 10/26/2023 CLINICAL DATA:  Shortness of breath. EXAM: CHEST - 2 VIEW COMPARISON:  09/09/2019. FINDINGS: Bilateral lung fields are clear. Bilateral costophrenic angles are clear. Normal cardio-mediastinal silhouette. No acute osseous abnormalities. The soft tissues are within normal limits. IMPRESSION: No active cardiopulmonary disease.  Electronically Signed   By: Jules Schick M.D.   On: 10/26/2023 15:49    Procedures Procedures    Medications Ordered in ED Medications - No data to display  ED Course/ Medical Decision Making/ A&P                                 Medical Decision Making Amount and/or Complexity of Data Reviewed Labs: ordered. Decision-making details documented in ED Course.    Details: Labs ordered reviewed and interpreted D-dimer is negative.  Lipase is normal, CBC is normal, Radiology: ordered and independent interpretation performed. Decision-making details documented in ED Course.    Details: Chest x-ray shows no acute cardiopulmonary disease ECG/medicine tests: ordered. Decision-making details documented in ED Course.    Details: EKG normal sinus normal EKG  Risk Prescription drug management. Risk Details: Patient counseled on results she is given a prescription for hydrocodone and ibuprofen.  Patient is advised to follow-up with her primary care physician for recheck she should return to the emergency department if any problems.           Final Clinical Impression(s) / ED Diagnoses Final diagnoses:  Right-sided chest wall pain    Rx / DC Orders ED Discharge Orders          Ordered    HYDROcodone-acetaminophen (NORCO/VICODIN) 5-325 MG tablet  Every 4 hours PRN        10/26/23 1910    ibuprofen (ADVIL) 800 MG tablet  Every 8 hours PRN        10/26/23 1910           An After Visit Summary was printed and given to the patient.    Elson Areas, [place] 10/27/23 1820    Durwin Glaze, MD 10/27/23 [phone]
